# Patient Record
Sex: Male | Born: 1955
Health system: Southern US, Community
[De-identification: ages and names within clinical notes are randomized; demographics above are authoritative.]

## PROBLEM LIST (undated history)

## (undated) DIAGNOSIS — I2510 Atherosclerotic heart disease of native coronary artery without angina pectoris: Secondary | ICD-10-CM

## (undated) DIAGNOSIS — Z972 Presence of dental prosthetic device (complete) (partial): Secondary | ICD-10-CM

## (undated) DIAGNOSIS — E785 Hyperlipidemia, unspecified: Secondary | ICD-10-CM

## (undated) DIAGNOSIS — I2119 ST elevation (STEMI) myocardial infarction involving other coronary artery of inferior wall: Secondary | ICD-10-CM

## (undated) DIAGNOSIS — M2341 Loose body in knee, right knee: Secondary | ICD-10-CM

## (undated) DIAGNOSIS — S83249A Other tear of medial meniscus, current injury, unspecified knee, initial encounter: Secondary | ICD-10-CM

## (undated) DIAGNOSIS — K589 Irritable bowel syndrome without diarrhea: Secondary | ICD-10-CM

## (undated) DIAGNOSIS — I252 Old myocardial infarction: Secondary | ICD-10-CM

## (undated) HISTORY — DX: Old myocardial infarction: I25.2

## (undated) HISTORY — PX: KNEE ARTHROSCOPY: SHX127

## (undated) HISTORY — DX: Atherosclerotic heart disease of native coronary artery without angina pectoris: I25.10

## (undated) HISTORY — DX: Irritable bowel syndrome, unspecified: K58.9

## (undated) HISTORY — PX: FOOT SURGERY: SHX648

---
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
## 2002-01-04 ENCOUNTER — Emergency Department (HOSPITAL_COMMUNITY): Admission: EM | Admit: 2002-01-04 | Discharge: 2002-01-04 | Payer: Self-pay | Admitting: Emergency Medicine

## 2002-01-04 ENCOUNTER — Encounter: Payer: Self-pay | Admitting: Emergency Medicine

## 2002-07-11 ENCOUNTER — Encounter: Payer: Self-pay | Admitting: General Surgery

## 2002-07-11 ENCOUNTER — Ambulatory Visit (HOSPITAL_COMMUNITY): Admission: RE | Admit: 2002-07-11 | Discharge: 2002-07-11 | Payer: Self-pay | Admitting: Family Medicine

## 2002-07-11 ENCOUNTER — Encounter: Payer: Self-pay | Admitting: Family Medicine

## 2002-07-11 ENCOUNTER — Inpatient Hospital Stay (HOSPITAL_COMMUNITY): Admission: EM | Admit: 2002-07-11 | Discharge: 2002-07-12 | Payer: Self-pay | Admitting: Emergency Medicine

## 2002-07-11 ENCOUNTER — Encounter (INDEPENDENT_AMBULATORY_CARE_PROVIDER_SITE_OTHER): Payer: Self-pay | Admitting: *Deleted

## 2002-07-12 HISTORY — PX: APPENDECTOMY: SHX54

## 2002-08-25 ENCOUNTER — Encounter: Admission: RE | Admit: 2002-08-25 | Discharge: 2002-08-25 | Payer: Self-pay | Admitting: General Surgery

## 2002-08-25 ENCOUNTER — Encounter: Payer: Self-pay | Admitting: General Surgery

## 2004-03-28 ENCOUNTER — Emergency Department (HOSPITAL_COMMUNITY): Admission: EM | Admit: 2004-03-28 | Discharge: 2004-03-28 | Payer: Self-pay | Admitting: Emergency Medicine

## 2005-06-11 ENCOUNTER — Emergency Department (HOSPITAL_COMMUNITY): Admission: EM | Admit: 2005-06-11 | Discharge: 2005-06-11 | Payer: Self-pay | Admitting: Family Medicine

## 2007-11-22 ENCOUNTER — Inpatient Hospital Stay (HOSPITAL_COMMUNITY): Admission: AD | Admit: 2007-11-22 | Discharge: 2007-11-26 | Payer: Self-pay | Admitting: Cardiology

## 2007-11-22 ENCOUNTER — Ambulatory Visit: Payer: Self-pay | Admitting: Cardiology

## 2007-11-22 DIAGNOSIS — I252 Old myocardial infarction: Secondary | ICD-10-CM

## 2007-11-22 HISTORY — DX: Old myocardial infarction: I25.2

## 2007-11-22 HISTORY — PX: CARDIAC CATHETERIZATION: SHX172

## 2007-12-02 ENCOUNTER — Ambulatory Visit: Payer: Self-pay | Admitting: Cardiology

## 2007-12-02 LAB — CONVERTED CEMR LAB
BUN: 4 mg/dL — ABNORMAL LOW (ref 6–23)
CO2: 31 meq/L (ref 19–32)
Calcium: 9 mg/dL (ref 8.4–10.5)
Chloride: 104 meq/L (ref 96–112)
Creatinine, Ser: 0.9 mg/dL (ref 0.4–1.5)
GFR calc Af Amer: 114 mL/min
GFR calc non Af Amer: 95 mL/min
Glucose, Bld: 91 mg/dL (ref 70–99)
Potassium: 3.8 meq/L (ref 3.5–5.1)
Sodium: 139 meq/L (ref 135–145)

## 2007-12-09 ENCOUNTER — Ambulatory Visit: Payer: Self-pay

## 2007-12-18 ENCOUNTER — Ambulatory Visit: Payer: Self-pay | Admitting: Cardiology

## 2008-02-18 ENCOUNTER — Ambulatory Visit: Payer: Self-pay | Admitting: Cardiology

## 2008-02-21 ENCOUNTER — Ambulatory Visit (HOSPITAL_COMMUNITY): Admission: RE | Admit: 2008-02-21 | Discharge: 2008-02-22 | Payer: Self-pay | Admitting: Cardiology

## 2008-02-21 ENCOUNTER — Ambulatory Visit: Payer: Self-pay | Admitting: Cardiology

## 2008-02-21 HISTORY — PX: CARDIAC CATHETERIZATION: SHX172

## 2008-02-27 ENCOUNTER — Ambulatory Visit: Payer: Self-pay | Admitting: Cardiology

## 2008-02-28 ENCOUNTER — Inpatient Hospital Stay (HOSPITAL_COMMUNITY): Admission: RE | Admit: 2008-02-28 | Discharge: 2008-02-29 | Payer: Self-pay | Admitting: Cardiology

## 2008-02-28 HISTORY — PX: CARDIAC CATHETERIZATION: SHX172

## 2008-03-05 ENCOUNTER — Ambulatory Visit: Payer: Self-pay | Admitting: Cardiology

## 2008-03-05 ENCOUNTER — Ambulatory Visit: Payer: Self-pay

## 2008-03-11 ENCOUNTER — Ambulatory Visit: Payer: Self-pay | Admitting: Cardiovascular Disease

## 2008-05-26 ENCOUNTER — Ambulatory Visit: Payer: Self-pay | Admitting: Cardiology

## 2008-11-27 DIAGNOSIS — Z9089 Acquired absence of other organs: Secondary | ICD-10-CM

## 2008-11-27 DIAGNOSIS — Z9189 Other specified personal risk factors, not elsewhere classified: Secondary | ICD-10-CM

## 2008-11-27 DIAGNOSIS — E7800 Pure hypercholesterolemia, unspecified: Secondary | ICD-10-CM | POA: Insufficient documentation

## 2008-11-27 DIAGNOSIS — Z9861 Coronary angioplasty status: Secondary | ICD-10-CM

## 2008-11-27 DIAGNOSIS — I2510 Atherosclerotic heart disease of native coronary artery without angina pectoris: Secondary | ICD-10-CM | POA: Insufficient documentation

## 2008-11-27 DIAGNOSIS — I2119 ST elevation (STEMI) myocardial infarction involving other coronary artery of inferior wall: Secondary | ICD-10-CM | POA: Insufficient documentation

## 2008-11-27 HISTORY — DX: ST elevation (STEMI) myocardial infarction involving other coronary artery of inferior wall: I21.19

## 2009-01-28 ENCOUNTER — Ambulatory Visit: Payer: Self-pay | Admitting: Cardiology

## 2010-12-06 NOTE — Cardiovascular Report (Signed)
[name], [name]                  ACCOUNT NO.:  [id number]   MEDICAL RECORD NO.:  [id number]          PATIENT TYPE:  INP   LOCATION:  2906                         FACILITY:  [facility]   PHYSICIAN:  Noralyn Pick. Eden Emms, MD, FACCDATE OF BIRTH:  03-07-1956   DATE OF PROCEDURE:  11/22/2007  DATE OF DISCHARGE:                            CARDIAC CATHETERIZATION   INDICATION:  Acute inferior wall MI.   Left main coronary artery had a 40-50%  distal taper.  Left anterior  descending artery had 70% mid vessel disease with a small aneurysmal  segment.  Distal LAD was normal.   First diagonal branch had 30-40% Moschcowitz disease.   Circumflex coronary artery primarily consisted of one OM branch with a  70-80% midvessel lesion.   There are extensive left-to-right collaterals with totally occluded  right coronary artery.   Right coronary artery was 100% occluded proximally.   RAO ventriculography showed minimal inferobasal hypokinesis.  There was  no MR.  Aortic pressure was 110/60, LV pressure was 111/6.   IMPRESSION:  The patient is having an acute inferior wall myocardial  infarction.  He has three-vessel disease.  Dr. Riley Kill will proceed with  intervention of the proximal right coronary artery.  The patient will  probably be staged to have his LAD and circ done later.      Noralyn Pick. Eden Emms, MD, [facility]  Electronically Signed     PCN/MEDQ  D:  11/22/2007  T:  11/23/2007  Job:  [id number]

## 2010-12-06 NOTE — Assessment & Plan Note (Signed)
[facility]                            CARDIOLOGY OFFICE NOTE   NAME:[name], [name]                         MRN:          [id number]  DATE:03/05/2008                            DOB:          31-Oct-1955    [name] is in for followup.  He was readmitted to the hospital electively  for repeat for probable percutaneous intervention of the circumflex.  He  had recently undergone cutting balloon angioplasty of in-stent  restenosis of his acute myocardial infarction lesion.  At the time of  his repeat catheterization, we got a good look at his circumflex using a  guiding catheter and with the administration of intracoronary  nitroglycerin.  Despite maximum dilatation, Dr. Excell Seltzer and I thought  that, that a even potentially a 2.25 mm drug-eluting platform might be  oversized.  I debated whether or not to treat this area, and given the  potential that the distal runoff territory might be considerably  smaller, we elected to defer at this point.  A relook at the right  suggested that the cutting balloon angioplasty, while still patent and  likely not causing symptoms, was substantially lost even at less than 2  weeks.  As a result, we did go ahead and place a drug-eluting stent into  the right coronary artery for in-stent restenosis.  The final  angiographic result was excellent.  Since discharge from the hospital,  the patient has been basically asymptomatic.  He has not been having any  recurrent chest pain.  Fortunately, he has not smoked.  He has had a  little bit of discomfort in the left groin, and this has bothered him,  although it has not been severe.   MEDICATIONS:  1. Aspirin 325 mg daily.  2. Plavix 75 mg daily.  3. Lisinopril 5 mg daily.  4. Metoprolol tartrate 25 mg one-half tablet b.i.d.  5. Simvastatin 40 mg daily.   On physical, he is alert and oriented and in no distress.  The blood  pressure 110/70.  The pulse is 73.  The lung fields are  clear.  The  cardiac rhythm is regular.  The right groin appears to be well-healed.  There is minimal ecchymosis over the left groin, and a slightly widened  left femoral artery.  There is a soft bruit over the left groin.   The electrocardiogram demonstrates normal sinus rhythm.  There is some  biphasic T-waves in 3 and aVF, but no T-wave inversion in the lateral  precordial leads.   We went ahead and did duplex imaging of the left groin.  There was no  evidence of significant AV fistula or pseudoaneurysm.   We will have [name] take it easy on his leg for the next week or to.  I  plan to see him back in followup probably in about 3 weeks.  We will  hold him out of work until March 16, 2008.  Should there be any  problems, then he will return to see [place].     [name]. Riley Kill, MD, [facility]  Electronically Signed    TDS/MedQ  DD: 03/07/2008  DT: 03/07/2008  Job #: [id number]

## 2010-12-06 NOTE — Assessment & Plan Note (Signed)
[facility]                            CARDIOLOGY OFFICE NOTE   NAME:[name], [name]                         MRN:          [id number]  DATE:12/18/2007                            DOB:          June 16, 1956    [name] is in for follow-up.  Cardiac-wise, he has continued to remain  stable.  He would like to return to work at [employer].  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  Our  tentative date for return is December 27, 2007.  We have told him he  probably should not try to lift over about 20 pounds.  This would be  approximately 4 weeks following treatment for his acute myocardial  infarction.  We purposely elected not to do the circumflex as it was not  obviously causing symptoms, and it was somewhat technically difficult  with a long left main and turned back circumflex in a somewhat smaller  caliber vessel.  The patient underwent radionuclide imaging.  Importantly, the patient exercised for 6 minutes.  He did not have chest  pain or significant ST-T wave abnormalities.  At peak exercise, there  was an inferior perfusion defect.  This was predominantly scar with  perhaps very mild peri-infarct ischemia, a fairly common finding with  reperfused disease.  His ejection fraction was 54%.  None of this  suggested active ischemia, particularly in the circumflex territory, and  as such, we have discussed treatment options.  He also wants to come off  of most of his medications, but it is important that these continue as  they are.   MEDICATIONS:  1. Aspirin 325 mg daily.  2. Plavix 75 mg daily.  3. Lisinopril 5 mg daily.  4. Metoprolol tartrate 25 mg 1/2 tablet b.i.d.  5. Simvastatin 40 daily.   PHYSICAL EXAMINATION:  VITAL SIGNS:  Blood pressure is 110/74, the pulse  is 60.  The weight is 159 pounds.  LUNG:  The lung fields are clear.  CARDIAC:  The cardiac rhythm is regular.   We reviewed [name] issues in  detail today.  Our plan will be to  continue aggressive risk factor reduction.  I have had an extensive long  ranging discussion with he and his wife today about what this  constitutes.  He understands that his long-term prognosis is probably  mostly related to how well he handles this.  He has been exercising  regularly, not smoking, and according to his wife their diet has  improved.  She actually has lost 5 pounds on the current diet.  With  this, hopefully he will continue to remain stable.  He is scheduled to  be seen in follow-up by Dr. Joette Catching, who will be assuming his  primary care.  The patient needs a lipid and liver profile, as well as  basic metabolic profile.  I will forward this on to Dr. Lysbeth Galas for his  the review.  We appreciate his care.   ADDENDUM:  We will see him back in follow-up in about 6 weeks  to 2  months.     Arturo Morton. Riley Kill, MD, [facility]  Electronically Signed    TDS/MedQ  DD: 12/19/2007  DT: 12/19/2007  Job #: [id number]   cc:   Delaney Meigs, M.D.

## 2010-12-06 NOTE — Cardiovascular Report (Signed)
NAMEZACHREY, DEUTSCHER                  ACCOUNT NO.:  [id number]   MEDICAL RECORD NO.:  [id number]          PATIENT TYPE:  INP   LOCATION:  2906                         FACILITY:  [facility]   PHYSICIAN:  Arturo Morton. Riley Kill, MD, FACCDATE OF BIRTH:  [birth year]   DATE OF PROCEDURE:  DATE OF DISCHARGE:                            CARDIAC CATHETERIZATION   INDICATIONS:  The patient is a 55 year old who developed chest pain.  EMS was called.  He was seen in the field, and diagnosis of inferior ST  elevation noted.  The cath lab was called.  He was brought straight to  the catheterization laboratory.  He underwent diagnostic catheterization  by Dr. Charlton Haws.  This was done acutely.  I was called in for  percutaneous coronary intervention.  The patient had a totally occluded  right coronary artery with other disease as described in the diagnostic  report.   PROCEDURE:  1. Percutaneous stenting of the right coronary artery using 20 x 2.75      Liberte non drug-eluting stent.  2. Intravascular ultrasound.   DESCRIPTION OF THE PROCEDURE:  The patient was brought to the  catheterization laboratory and prepped and draped in the usual fashion  and underwent diagnostic catheterization by Dr. Eden Emms.  We eventually  administered bivalve en route, and an ACT was checked and found to be  appropriate.  The patient received chewable aspirin in the field and 600  mg of oral clopidogrel in the catheterization laboratory.  With an  appropriate ACT, a JR-4 guiding catheter with side holes was utilized.  A Prowater wire was taken down the vessel.  As we were getting ready to  cross, the patient had some ST resolution, and with the first crossing  the vessel was opened with subtotal occlusion of the RCA.  The RCA was  subsequently crossed with a Prowater wire and predilatation done with a  2.25 x 15 apex balloon.  Following this, the vessel was stented with a  20 x 2.75 Liberte non drug-eluting platform.  Post  dilatation was then  carefully performed using a 3.25 Quantum Maverick.  There was a 15-mm  balloon.  The distal edges were dilated using 6-8 atm balloon dilatation  and throughout the center of the stent.  The stent was dilated at 15  atm.  There was marked improvement in the appearance of the vessel.  Following this, the catheter was removed.  This was followed by  intravascular ultrasound.  Intravascular ultrasound was performed  distally to proximally.  This demonstrated good apposition of the stent.  There was extensive calcification proximally.  There was extensive  plaque formation, particularly distally throughout the course of the  vessel.  However, the vessel was not obstructive.  There was no obvious  evidence of edge tear.   With adequate stent placement and restoration of TIMI 3 flow, the  patient's pain was relieved and his EKG improved.  All catheters were  subsequently removed, and the femoral sheath was sewn into place.  He  was taken to the holding area in satisfactory clinical condition.   HEMODYNAMIC  DATA:  1. Central aortic pressure was 127/73 and mean 100.  2. Left atrial pressure was 110/17.  3. There was no gradient on pullback across the aortic valve.   ANGIOGRAPHIC DATA:  1. The left main coronary artery demonstrates what appears to be about      20% narrowing distally.  The vessel then opens and demonstrates a      first diagonal with about 60% narrowing.  It is relatively small.      There is a large second diagonal with 20%-30% narrowing and an area      of segmental plaquing of 60%-70% in the mid vessel.  The apical      vessel has about 40%-50% area of narrowing.  2. The circumflex is a single marginal predominantly with diffuse 40%-      50% plaquing after the first marginal and then a focal 80%-90%      eccentric plaque.  The vessel was relatively small in caliber.      There was a tiny first marginal that comes off proximal wall of      this with  about 70% narrowing.  3. The right coronary artery is totally occluded in its proximal      portion.  There is extensive collateralization of the distal      vessel.  Following dilatation, the proximal right was reduced from      100%-0%.  Proximal to the site of total occlusion there is      extensive calcification as noted in the intravascular ultrasound      procedure.  Multiple 30% lesions were then noted throughout the      distal portion of the midvessel, and there was a fair amount of      ectasia and luminal irregularity at the crux just proximal to the      PDA takeoff.  The PDA and posterolateral branches were all fairly      large-caliber vessels without critical disease.  4. The ventriculogram demonstrates what appears to be fairly severe      inferior hypokinesis despite the collateralization.  Ejection      fraction to be estimated at 45%-50%.   Intravascular ultrasound was performed distally to proximally.  Distally, there was a fair amount of extensive plaquing throughout the  vessel with lumens that measure from 2.2 up to about 2.2 x 3 throughout.  The stent itself demonstrates good apposition from distal to proximal  edge.  Just proximal to the vessel is an extensive area of calcification  as well as plaque.  There is no obvious edge tear findings.   CONCLUSIONS:  1. Successful percutaneous stenting of the right coronary artery in      the setting of acute myocardial infarction using non drug-eluting      platform.  2. Residual disease with moderate disease of the left anterior      descending artery and somewhat more severe disease of the proximal      circumflex.   DISPOSITION:  At the present time, we will review with our colleagues  the options with regard to treatment of other vessels.  The circ appears  to be reasonably tight.  The LAD may be able to be treated medically.  I  will review these options with my colleagues.      Arturo Morton. Riley Kill, MD, [facility]   Electronically Signed     TDS/MEDQ  D:  11/22/2007  T:  11/23/2007  Job:  [id number]  cc:   Noralyn Pick. Eden Emms, MD, [facility]  Gildardo Cranker

## 2010-12-06 NOTE — H&P (Signed)
[name], [name]                  ACCOUNT NO.:  [id number]   MEDICAL RECORD NO.:  [id number]          PATIENT TYPE:  OIB   LOCATION:  NA                           FACILITY:  [facility]   PHYSICIAN:  Arturo Morton. Riley Kill, MD, FACCDATE OF BIRTH:  1956/03/03   DATE OF ADMISSION:  DATE OF DISCHARGE:                              HISTORY & PHYSICAL   CHIEF COMPLAINT:  Chest discomfort.   HISTORY OF PRESENT ILLNESS:  [name] is a 55 year old well known to me.  He previously presented with an acute inferior wall myocardial  infarction.  This was in May [address]40.  He underwent successful treatment  of the right coronary artery in the setting of acute myocardial  infarction using a non drug-eluting platform.  He had residual high-  grade disease of the circumflex.  He also had moderate disease of the  LAD.  We elected to treat the right, and to treat him medically.  He  then presented with recurrent chest pain and last week underwent repeat  catheterization.  At this time, he underwent cutting balloon angioplasty  of the RCA.  We deferred whether or not to proceed with the circumflex  coronary artery, but in fact the patient had a somewhat more worrisome  lesion of the circumflex than previously.  The distal vessel was a  moderate vessel.  I subsequently reviewed the films with Dr. Juanda Chance and  Dr. Excell Seltzer, and we felt that treatment of this lesion should be  recommended.  His LV inferior hypokinesis has improved.  He does,  however has a mid inferior wall motion abnormality, potentially  attributable to the circumflex distribution.   CURRENT MEDICATIONS:  1. Aspirin 325 mg daily.  2. Plavix 75 mg daily.  3. Lisinopril 5 mg daily.  4. Metoprolol tartrate 25 mg one-half tablet b.i.d.  5. Simvastatin 40 mg daily.   PAST MEDICAL HISTORY:  Negative for diabetes, hypertension, or  significant hypercholesterolemia.  He previously used tobacco, but has  backed off on this.   PAST SURGICAL HISTORY:   Remarkable for foot surgery and appendectomy.   SOCIAL HISTORY:  The patient has a full-time job.  He also works with  [employer].   FAMILY HISTORY:  His mother is in good health.  His father did die of an  MI in his 67s.   PHYSICAL EXAMINATION:  GENERAL:  He is alert and oriented.  VITAL SIGNS:  His weight is 163 pounds, blood pressure 110/80, and pulse  is 60.  LUNGS:  Fields are clear.  CARDIAC:  Rhythm is regular without significant murmur.  EXTREMITIES:  Distal pulses are intact.   We have elected to go ahead recommend a percutaneous intervention of the  circumflex and this will be done on Friday.  The patient is agreeable.  I have reviewed the films with the patient and his spouse previously.      Arturo Morton. Riley Kill, MD, [facility]  Electronically Signed     TDS/MEDQ  D:  02/27/2008  T:  02/28/2008  Job:  [id number]

## 2010-12-06 NOTE — H&P (Signed)
NAMENAMAN, SPYCHALSKI                  ACCOUNT NO.:  [id number]   MEDICAL RECORD NO.:  [id number]         PATIENT TYPE:  COIB   LOCATION:                               FACILITY:  [facility]   PHYSICIAN:  Arturo Morton. Riley Kill, MD, FACCDATE OF BIRTH:  [birth year]   DATE OF ADMISSION:  02/21/2008  DATE OF DISCHARGE:                              HISTORY & PHYSICAL   CHIEF COMPLAINT:  Chest discomfort.   HISTORY OF PRESENT ILLNESS:  Mr. Duchene is a 55 year old gentleman who  previously presented with an acute myocardial infarction.  He apparently  had a question of light infarct in the past.  He presented to EMS with  ST elevation in the inferior leads.  He underwent urgent  catheterization.  At cath, there was about 20% distal left main  narrowing.  There was a first diagonal of 60%.  There was a large second  diagonal of 20-30% narrowing and an area of 60-70% narrowing, and apical  40-50%.  The circumflex was relatively small in caliber, but had a focal  80-90% eccentric plaque, the right was totally occluded and was  successfully stented.  There was extensive plaquing throughout the  vessel.  He subsequently has done well.  He had a followup radionuclide  imaging study.  The procedure was done at the beginning of May 2009 and  a followup nuclide studies suggested no significant lateral wall  ischemia.  There was evidence of inferior infarct with ejection fraction  of 54%.  Inferior defect was noted.  He was discharged from the  hospital. He has gotten along reasonably well, but for the past 3 weeks  he has noted some chest discomfort in the upper left chest.  He has also  had some tightness in the left shoulder.   As a result, we have recommended admission to the hospital for repeat  diagnostic cardiac catheterization.   PAST MEDICAL HISTORY:  Negative for diabetes, hypertension, or  hypercholesterolemia.  He has had a history of tobacco use.   The patient has had foot surgery and  appendectomy.   ALLERGIES:  Include no known drug allergies.   MEDICATIONS:  1. Aspirin 325 mg daily.  2. Plavix 75 mg daily.  3. Lisinopril 5 mg daily.  4. Metoprolol tartrate 25 mg one-half tablet p.o. b.i.d.  5. Simvastatin 40 mg daily.   FAMILY HISTORY:  His mother is in good health.  His father died of an MI  in his 24s.  He has siblings in good health currently.   REVIEW OF SYSTEMS:  He has had no blood in his stool.  He does have a  little bit of shoulder tightness as previously noted.  He has not had  loss of vision or major ENT problems.  Review of systems is otherwise  negative.   PHYSICAL EXAMINATION:  An alert and oriented gentleman, in no acute  distress.  Blood pressure 110/80, pulse 60, and weight 163.  Jugular  veins nondistended.  There are no carotid bruits.  Lung fields are clear  to auscultation and percussion.  PMI nondisplaced.  Normal  first and  second heart sound without murmurs, gallops, or rubs.  Abdomen is soft  without obvious hepatosplenomegaly.  Extremities, no edema.  Neuro is  nonfocal.   IMPRESSION:  1. Recurrent left chest discomfort with prior history of percutaneous      intervention for acute myocardial infarction with residual disease      in the circumflex system and recurrence of symptoms.  2. Hypercholesterolemia, on lipid-lowering therapy.   PLAN:  I have discussed the risks, options, and alternatives with the  patient.  He is agreeable to proceed.  We plan to re-look at his  coronary anatomy to make sure his status still open and that he has not  had a change in the circumflex system.  He may require dilatation of the  circumflex, but if there is absolutely no change, we may elect a  conservative course of management.      Arturo Morton. Riley Kill, MD, [facility]  Electronically Signed     TDS/MEDQ  D:  02/18/2008  T:  02/19/2008  Job:  [id number]

## 2010-12-06 NOTE — H&P (Signed)
[name], [name]                  ACCOUNT NO.:  [id number]   MEDICAL RECORD NO.:  [id number]          PATIENT TYPE:  INP   LOCATION:  2906                         FACILITY:  [facility]   PHYSICIAN:  Arturo Morton. Riley Kill, MD, FACCDATE OF BIRTH:  August 16, 1955   DATE OF ADMISSION:  11/22/2007  DATE OF DISCHARGE:                              HISTORY & PHYSICAL   PRIMARY CARDIOLOGIST:  Arturo Morton. Riley Kill, MD, [facility]   PRIMARY CARE PHYSICIAN:  Leslye Peer, MD in [place].   HISTORY OF PRESENT ILLNESS:  This is a 55 year old Caucasian male with  no prior history of cardiac disease (the patient states that he thought  that someone told him he had a light MI in the remote past about 25  years ago) with sudden complaint of severe pain in the back of his neck  with associated shortness of breath, nausea, and diaphoresis.  The  patient called EMS, EMS came, and EKG was completed revealing ST  elevation in leads II, III, and aVL.  He was brought emergently to [facility] for ST-elevated MI and brought to the  catheterization lab.  Dr. Charlton Haws saw the patient emergently and  got access and did cardiac catheterization revealing totally occluded  right coronary artery with collaterals and also obstructive disease in  the circumflex.  The patient was given baby aspirin en route by EMS, and  Dr. Riley Kill is now in attending for intervention.   REVIEW OF SYSTEMS:  Positive for shortness of breath, neck pain, chest  pain, diaphoresis, and low-grade nausea without vomiting.  Otherwise,  negative.   PAST MEDICAL HISTORY:  Negative for diabetes, hypertension, and  hypercholesterolemia.  Positive for tobacco abuse.   PAST SURGICAL HISTORY:  Foot surgery and appendectomy.   SOCIAL HISTORY:  He lives at [place] with his wife.  He is a Psychologist, educational.  He has 40-pack-year tobacco.  Occasional alcohol use.  Negative for drug  abuse.   FAMILY HISTORY:  Mother is in good health.  Father died of  an MI in his  36s.  He has siblings in good health.   CURRENT MEDICATION:  Flexeril 10 mg p.r.n.   ALLERGIES:  No known drug allergies.   CURRENT LABS:  Sodium 137, potassium 4.2, chloride 101, CO2 7,  creatinine 0.8, and glucose 142.  Hemoglobin 12.9 and hematocrit 38.0.  EKG revealing heart rate of 64 beats per minute with ST elevation in  leads II, III, and aVL.   PHYSICAL EXAMINATION:  VITAL SIGNS:  Blood pressure 113/74, pulse 67,  respirations 26, O2 sat 100% on 2 L, and weight 175 pounds.  GENERAL:  He is awake, alert, and oriented with complaints of severe  neck and shoulder pain.  HEENT:  Head is normocephalic and atraumatic.  Eyes, PERRLA.  Mucous  membranes of mouth are pink and moist.  Tongue is midline.  NECK:  Supple.  There is no JVD.  No carotid bruits are present.  CARDIOVASCULAR:  Regular rate and rhythm without murmurs, rubs, or  gallops.  Pulses are 2+ and equal bilaterally.  LUNGS:  Essentially clear to auscultation.  ABDOMEN:  Soft and nontender with 2+ bowel sounds.  No rebound or  guarding is noted.  EXTREMITIES:  Without clubbing, cyanosis, or edema.  NEURO:  Cranial nerves II through XII are grossly intact.   IMPRESSION:  1. Acute ST-elevated myocardial infarction inferiorly.  2. Tobacco abuse.   PLAN:  The patient is taken emergently to cardiac catheterization lab,  where cardiac catheterization was performed by Dr. Charlton Haws  emergently revealing totally occluded right coronary artery with also  some circumflex disease.  Collaterals were noted distally in the right  coronary artery.  The patient is currently undergoing emergent PCI per  Dr. Shawnie Pons with more recommendations post procedure per Dr.  Riley Kill.  The patient will also be given smoking cessation consultation,  and lipids and LFTs will be ordered in the morning.  The patient will be  admitted and followed closely post procedure.      Bettey Mare. Lyman Bishop, NP      Arturo Morton.  Riley Kill, MD, [facility]  Electronically Signed    KML/MEDQ  D:  11/22/2007  T:  11/23/2007  Job:  [id number]

## 2010-12-06 NOTE — Letter (Signed)
February 18, 2008    Delaney Meigs, M.D.  [address]  [place], [place]  [address]   RE:  [name], [name]  MRN:  [id number]  /  DOB:  [birth year]   Dear Marolyn Haller,   I had the pleasure of seeing your nice patient [name] in the office  today in followup.  Cardiac wise, he is stable.  However, he has  recently had the onset of some discomfort in the upper left chest.  He  has been under a fair amount of stress at work.  He says that he has not  been smoking.  His cholesterol results from your office look excellent.  Nonetheless, the symptoms have been concerning to the patient, and as  you know at the time of his original presentation, we did step the right  residual, but we did not change the circumflex.   Now, he is somewhat concerned about this.   On physical exam today, the blood pressure 110/80 and the pulse is 60.  The lung fields are clear.  The cardiac rhythm is regular.   Electrocardiogram demonstrates normal sinus rhythm with possible  infarct, indeterminate age.   Overall, he is stable, but I am concerned about the recurrence of  symptoms.  We elected initially to consider dilatation of the  circumflex, but then I elected to recommend the conservative course  of management.  He has been under quite a bit of stress, this may be  contributing to the chest discomfort, but he is concerned about it so am  I.  We have elected to recommend a repeat catheterization to reassess  his coronary anatomy.  I will be in touch with you once the results of  this are complete.  We have arranged this for Friday of this week.    Sincerely,      Arturo Morton. Riley Kill, MD, [facility]  Electronically Signed    TDS/MedQ  DD: 02/18/2008  DT: 02/19/2008  Job #: [id number]

## 2010-12-06 NOTE — Discharge Summary (Signed)
[name], [name]                  ACCOUNT NO.:  [id number]   MEDICAL RECORD NO.:  [id number]          PATIENT TYPE:  OIB   LOCATION:  2622                         FACILITY:  [facility]   PHYSICIAN:  Arturo Morton. Riley Kill, MD, FACCDATE OF BIRTH:  [birth year]   DATE OF ADMISSION:  02/21/2008  DATE OF DISCHARGE:  02/22/2008                               DISCHARGE SUMMARY   CHIEF COMPLAINT:  Chest discomfort.   HISTORY OF PRESENT ILLNESS:  Mr. Sabino is a very pleasant 55 year old  well known to me.  He previously presented with a myocardial infarction.  He underwent percutaneous intervention in early May, at that time had  stenting of the right coronary artery.  He continued to have high-grade  residual disease of a somewhat smaller caliber circumflex artery.  We  elected to treat him medically and follow-up myocardial perfusion  imaging studies were normal.  He developed recurrent chest pain and was  brought to the catheterization laboratory in followup for repeat  evaluation.   Please see admission history and physical for further details.   HOSPITAL COURSE:  The patient was admitted and underwent repeat cardiac  catheterization.  This demonstrated scattered nonobstructive disease of  the distal LAD.  There was a single marginal branch with about an 80%  stenosis.  There was a high-grade in-stent restenosis in the midportion  of the right coronary artery in the midportion of the stent.  This was  successfully dilated using a cutting balloon angioplasty, largely  because the area of restenosis was not diffuse, in-stent restenosis, and  rather very focal narrowing of less than 10 mm.  The distal right  coronary artery was a large caliber vessel.  The LV was improved from  the original study with only mild residual inferior hypokinesis.   Following the procedure, the patient was taken to the 2600 unit and she  subsequently removed.  At the time of discharge, his groin was stable  and we  reviewed his course in detail with him.  His medicines were  reviewed.  The patient was discharged in satisfactory clinical condition  and it is my plan to call him this week.  We plan to review with my  colleagues his residual circumflex disease.  The circumflex itself is  approachable, but could be technically challenging because of a long  left main, and a smaller caliber.  We will decide whether he needs  continued medical therapy, we will discuss percutaneous intervention.  This will be reviewed and I will communicate this to the patient by  phone and we will see him in followup in the office.  This was reviewed  with the patient in detail.   DISCHARGE INSTRUCTIONS:  1. The patient is to take it easy over the next several days.  2. Use soap and water on his groin which is stable at the time of      discharge.  3. Avoid heavy lifting.   MEDICATIONS AT DISCHARGE:  1. Aspirin 325 mg daily.  2. Clopidogrel 75 mg daily.  3. Lisinopril 5 mg daily.  4. Simvastatin 40  mg daily,  5. Metoprolol 25 mg 1/2 tablet twice daily.  6. Nitroglycerin p.r.n.   CONDITION AT DISCHARGE:  Improved.   FINAL DIAGNOSES:  1. Coronary artery disease with prior inferior wall myocardial      infarction and subsequent in-stent restenosis treated with primary      percutaneous coronary intervention.  2. Hypercholesterolemia.  3. Prior foot surgery.  4. Prior appendectomy.      Arturo Morton. Riley Kill, MD, [facility]  Electronically Signed     TDS/MEDQ  D:  02/22/2008  T:  02/22/2008  Job:  [id number]

## 2010-12-06 NOTE — Assessment & Plan Note (Signed)
[facility]                                 ON-CALL NOTE   NAME:Mccoll, [name]                         MRN:          [id number]  DATE:02/26/2008                            DOB:          Oct 27, 1955    I called [name] today.  I reviewed his films with both Dr. Juanda Chance and  Dr. Excell Seltzer.  To summarize, the patient has previously been treated for  instent restenosis in his right coronary artery.  He has remaining  fairly high grade lesion in the circumflex, which supplies a single  branch.  We talked about various strategies.  I reviewed this with Dr.  Juanda Chance and then subsequently with Dr. Excell Seltzer, both were in favor of  going ahead and treating the vessel.  We have tentatively set the  patient up to have the procedure done on Friday of this week.  He is  agreeable and actually wishes to proceed.     Arturo Morton. Riley Kill, MD, [facility]  Electronically Signed    TDS/MedQ  DD: 02/27/2008  DT: 02/28/2008  Job #: [id number]

## 2010-12-06 NOTE — Assessment & Plan Note (Signed)
[facility]                            CARDIOLOGY OFFICE NOTE   NAME:Tesoriero, KASPIAN MUCCIO                         MRN:          [id number]  DATE:05/26/2008                            DOB:          11/27/1955    Mr. Peak is in for followup.  He had one week in where he really felt  bad.  There was nothing specific and he had no chest pain whatsoever.  He just said he felt little down and depressed.  He wonders whether this  may be something that he has.  He has had no real chest tightness.  His  mother has recently been in the hospital with what sounds like atrial  fibrillation.  Otherwise, he has been stable.   MEDICATIONS:  1. Aspirin 325 mg daily.  2. Plavix 75 mg daily.  3. Lisinopril 5 mg daily.  4. Simvastatin 40 mg daily.  5. Metoprolol 25 mg one-fourth tablet p.o. b.i.d.   PHYSICAL EXAMINATION:  GENERAL:  He is alert and oriented in no  distress.  VITAL SIGNS:  The blood pressure is 110/65, the pulse is 67.  LUNGS:  The lung fields are clear.  CARDIAC:  Rhythm is regular.  There is no significant gallop noted.  EXTREMITIES:  No edema.   IMPRESSION:  1. Coronary artery disease status post inferior wall infarction      treated with stenting to the right coronary artery in May 2009 and      subsequent cutting balloon angioplasty for right coronary artery in-      stent restenosis, February 20, 2008, followed by a PROMUS drug-eluting      stent in August 2009.  2. Hypercholesterolemia.  3. History of tobacco use.   PLAN:  1. Continue current medical regimen.  2. Follow up with Dr. Ardeen Garland.  3. Encourage continuation of tobacco discontinuation.     Arturo Morton. Riley Kill, MD, [facility]  Electronically Signed    TDS/MedQ  DD: 05/26/2008  DT: 05/27/2008  Job #: [id number]   cc:   Delaney Meigs, M.D.

## 2010-12-06 NOTE — Assessment & Plan Note (Signed)
[facility]                            CARDIOLOGY OFFICE NOTE   NAME:[name], [name]                         MRN:          [id number]  DATE:03/11/2008                            DOB:          1955-10-27    The patient seen in the [facility] on March 11, 2008.   PRIMARY CARDIOLOGIST:  [name]. Billy Kill, MD, [facility]   PRIMARY CARE PHYSICIAN:  Billy Meigs, MD   This is a 55 year old married white male patient, who has history of  coronary artery disease, who recently underwent cutting balloon  angioplasty of in-stent restenosis of an RCA.  He had Promus drug-  eluting stent deployed to the RCA on February 28, 2008.  He had a cutting  balloon angioplasty of the RCA on February 21, 2008, and came in on March 05, 2008.   Since the patient has been at home, he has done quite well.  He denies  any chest pain, palpitations, dizziness, or presyncope.  He did have  some trouble with pain in his left coronary and underwent duplex  scanning, which showed no evidence of pseudoaneurysm.   CURRENT MEDICATIONS:  1. Aspirin 325 mg daily.  2. Plavix 75 mg daily.  3. Lisinopril 5 mg daily.  4. Metoprolol 25 mg one-quarter b.i.d.  5. Simvastatin 40 mg daily.   PHYSICAL EXAMINATION:  GENERAL:  This is a pleasant 55 year old white  male in no acute distress.  VITAL SIGNS:  Blood pressure 123/75, pulse 55, weight 162.  NECK:  Without JVD, HJR, bruit, or thyroid enlargement.  LUNGS:  Clear, anterior, posterior, and lateral.  HEART:  Regular rate and rhythm at 70 beats per minute.  Normal S1 and  S2.  Positive S4.  No murmur, rub, bruit, thrill, or heave noted.  ABDOMEN:  Soft without organomegaly, masses, lesions, or abnormal  tenderness.  Left groin has a small knot and hematoma that is resolving.  Bruising is resolved.  Right groin is stable.  LOWER EXTREMITIES:  Without cyanosis, clubbing, or edema.  He has good  distal pulses.   IMPRESSION:  1. Coronary  artery disease status post inferior wall myocardial      infarction treated with stent to the RCA in May 2009.  2. Status post cutting balloon angioplasty of the RCA for restenosis      on February 21, 2008.  3. Status post Promus drug-eluting stent to the RCA on February 28, 2008,      for in-stent restenosis.  4. Status post drug-eluting stent to the RCA on March 05, 2008.  He      still has residual circumflex disease.  5. Hypercholesterolemia.  6. Prior tobacco use, quit in May.  7. Family history of coronary artery disease.   PLAN:  The patient is stable from a cardiac standpoint.  I have written  him a note that he can return to work on Monday with no heavy lifting  greater than 20 pounds.  He will see Dr. Riley Brewer back in 2-3 months.      Jacolyn Reedy, PA-C  Electronically  Signed      Everardo Beals Juanda Chance, MD, [facility]  Electronically Signed   ML/MedQ  DD: 03/11/2008  DT: 03/11/2008  Job #: [id number]   cc:   [name], M.D.

## 2010-12-06 NOTE — Cardiovascular Report (Signed)
[name], SLINKER                  ACCOUNT NO.:  [id number]   MEDICAL RECORD NO.:  [id number]          PATIENT TYPE:  INP   LOCATION:  2629                         FACILITY:  [facility]   PHYSICIAN:  Billy Morton. 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  He  initially presented with an acute inferior wall myocardial infarction in  early May.  At that time, he had acute stenting of the right coronary  artery.  We elected to treat a complicated circumflex medically.  He has  subsequently done well, but recently developed some recurrent chest  discomfort.  As a result, he was brought back in and had a high-grade in-  stent restenosis of the right coronary artery which was treated with  cutting balloon.  He continued to have moderately high-grade disease of  the circumflex with about an 80-90% stenosis and we debated whether or  not to treat this.  After review with Dr. Juanda Chance and Dr. Excell Seltzer, we  elected to recommend percutaneous opening of the circumflex artery.  He  was brought back today for that study.   PROCEDURES:  1. Percutaneous stenting with a drug-eluting stent of a recurrent in-      stent restenosis.  2. Left coronary angiography.   DESCRIPTION OF PROCEDURE:  The patient was brought to the cath lab and  prepped and draped in the usual fashion.  Through an anterior puncture,  the left femoral artery was entered and a 6-French sheath was placed.  Bivalirudin was given according to protocol.  With the initial  injections of the right coronary artery to reassess the treatment from a  week earlier, the patient had some hazy restenosis of about 50-60% in  the midportion of the stent which had been nicely treated with cutting  balloon angioplasty 1 week earlier.  We then used a CLS 3.5 to gain  access to the left main and  circumflex, and with this additional views  of the circumflex were obtained.  Unfortunately, with a larger guiding  catheter, the appearance of the circumflex suggested that at best in the  basal state this vessel was no more than about 1.5-mm artery.  Intracoronary nitroglycerin was then administered, and despite this, it  did not appear to be much larger than 2.0.  I called Calton Dach to the  catheterization laboratory and Dr. Excell Seltzer and I reviewed the films in  detail.  At that time, we felt that with treatment with a 2-0 non-drug-  eluting stent, the likelihood of restenosis given the length of  treatment was really quite high.  In addition, it was felt that a 2.25  drug-eluting platform would likely be significantly oversized.  As a  result and given the distribution of the vessel, I carefully decided not  to proceed with this treating this.  In contrast, we were concerned  about the in-stent restenosis from 1 week earlier.  The Bivalirudin had  been given according to  protocol and ACT was appropriate.  I discussed  the findings with the patient, and we elected to proceed on with  treating the in-stent renarrowing now with a drug-eluting stent.  A 15 x  2.75 Promus drug-eluting stent was then deployed at 15 atmospheres and  subsequently post dilated with a 3-0 [place] Voyager balloon to 14-15  atmospheres as well.  There was marked improvement in the appearance of  the artery.  I then reviewed the pictures with the patient, and then  went back and I reviewed them with his wife in the control room.  He  tolerated the procedure well and there were no complications.  The  femoral sheath was sutured into place.  He was taken to the holding area  in satisfactory clinical condition.   ANGIOGRAPHIC DATA:  1. The left main is a somewhat smaller caliber vessel of just over 2      mm, probably is in the range of about 2.2-2.5 mm in size.  There      was not damping of the guiding catheter,  however, with engagement.      The circumflex itself is about 1.5 to 175-mm artery that has an 80-      90% calcified stenosis in the midportion.  There is more segmental      plaquing proximally in the vessel and the lesion itself is about 15      mm in length.  The distal vessel was a single caliber marginal      branch, and we elected to not intervene on this vessel.  2. The right coronary artery is a very large caliber vessel supplying      a posterior descending branch and multiple posterolateral branches.      The findings in this vessel were characterized from 1 week earlier.      In the area that had been treated week earlier for focal in-stent      restenosis with a cutting balloon, there was recurrent 50-60% hazy      renarrowing in this location.  As a result, we elected to treat      this and there was a reduction in stenosis from 60 to 0%.  There      was a good angiographic result at the completion of procedure.   CONCLUSIONS:  1. Successful percutaneous stenting of in-stent restenosis of the      right coronary artery.  2. Continued medical therapy of the circumflex artery.   DISPOSITION:  The patient will be followed closely.  Should there be any  change in status, we will reassess treatment.      Billy Morton. Riley Kill, MD, [facility]  Electronically Signed     TDS/MEDQ  D:  02/28/2008  T:  02/29/2008  Job:  [id number]   cc:   [name], M.D.  CV Laboratory

## 2010-12-06 NOTE — Discharge Summary (Signed)
[name], [name]                  ACCOUNT NO.:  [id number]   MEDICAL RECORD NO.:  [id number]          PATIENT TYPE:  INP   LOCATION:  3705                         FACILITY:  [facility]   PHYSICIAN:  [name], ANP DATE OF BIRTH:  02/20/1956   DATE OF ADMISSION:  11/22/2007  DATE OF DISCHARGE:  11/26/2007                               DISCHARGE SUMMARY   DISCHARGE DIAGNOSIS:  Acute inferior ST-segment elevation myocardial  infarction.   SECONDARY DIAGNOSES:  1. Coronary artery disease.  2. Ongoing tobacco abuse.  3. Hyperlipidemia.   ALLERGIES:  No known drug allergies.   PROCEDURES:  Left heart cardiac catheterization with successful  percutaneous coronary intervention and stenting of an occluded right  coronary artery with placement of 2.75 x 20 mm Liberte bare-metal stent.   HISTORY OF PRESENT ILLNESS:  A 55 year old Caucasian male revealed with  no prior cardiac history, who was in his usual state health until Nov 22, 2007, when he had sudden onset of severe posterior neck pain associated  with shortness of breath, nausea, and diaphoresis.  The patient  activated EMS, and upon EMS arrival, EKG was performed revealing ST  elevation in leads II, III, and aVF.  The patient was taken emergently  to be [facility].   HOSPITAL COURSE:  Left heart cardiac catheterization was performed  revealing an occluded proximal right coronary artery with moderate  diffuse stenosis throughout the left coronary tree including a 60-70%  stenosis in the proximal-to-mid LAD and an 80% stenosis in the proximal  left circumflex.  LV function was normal with minimal inferobasal  hypokinesis.  Attention was turned to the right coronary artery, which  was felt to be the infarcted vessel, and this was successfully stented  with a 2.75 x 20 mm Liberte bare-metal stent.  The patient tolerated the  procedure well and was monitored post procedure in the Coronary  Intensive Care Unit.  He did  peak his CK at 2994, MB at 284.4, and  troponin I at greater than 100.  The patient was transferred out to the  floor and counseled on smoking cessation.  Films were reviewed by Dr.  Riley Kill for consideration of PCI to the left circumflex; however, it was  felt that percutaneous treatment of the circumflex could be higher risk  and unfavorable secondary to angulation tortuosity off the left main.  Further, because [name] has had no additional discomfort, we have  offered this time for medical therapy.  We will arrange followup with  Dr. Riley Kill in 1 week as well as an exercise Myoview in approximately 2  weeks to determine the ischemic burden of the left circumflex lesion.   [name] has been ambulating without difficulty or current symptoms,  will be discharged home today in good condition.   DISCHARGE LABS:  Hemoglobin 13.3, hematocrit 39.2, WBC 11.2, and  platelets 301.  Sodium 138, potassium 3.9, chloride 110, CO2 24, BUN 75,  creatinine 0.86, and glucose 92.  Total bilirubin 0.65, alkaline  phosphatase 94, AST 100, ALT 40, total protein 5.4, albumin 2.7, and  calcium 8.9.  CK 1717, MB 107.0, and troponin I 56.72.  Total  cholesterol 163, triglycerides 87, HDL 29, and LDL 107.  TSH 0.868.   DISPOSITION:  The patient is being discharged home today in good  condition.   FOLLOWUP:  He has an appointment.  We will arrange for followup with Dr.  Riley Kill on Dec 02, 2007 at 2:45 p.m.  We will also arrange for an  outpatient exercise Myoview on Dec 09, 2007 at 9 a.m.  The patient is  asked to follow up with Dr. Gildardo Cranker as previously scheduled.   DISCHARGE MEDICATIONS:  1. Aspirin 325 mg daily.  2. Plavix 75 mg daily.  3. Lipitor 80 mg nightly.  4. Lisinopril 5 mg daily.  5. Lopressor 12. 5 mg b.i.d.  6. Nitroglycerin 0.4 mg sublingual p.r.n. chest pain.   PENDING LAB STUDIES:  None.   THE PATIENT'S DISCHARGING TIME:  60 minutes including physician time.      [name], ANP     CB/MEDQ  D:  11/26/2007  T:  11/27/2007  Job:  [id number]   cc:   Gildardo Cranker, MD

## 2010-12-06 NOTE — Discharge Summary (Signed)
[name], [name]                  ACCOUNT NO.:  [id number]   MEDICAL RECORD NO.:  [id number]          PATIENT TYPE:  INP   LOCATION:  2629                         FACILITY:  [facility]   PHYSICIAN:  Arturo Morton. Riley Kill, MD, FACCDATE OF BIRTH:  01-Jun-1956   DATE OF ADMISSION:  02/28/2008  DATE OF DISCHARGE:                               DISCHARGE SUMMARY   FINAL DIAGNOSES:  1. Coronary artery disease with repeat cardiac catheterization,      percutaneous intervention for recurrent in-stent restenosis.  2. Hypercholesterolemia.  3. Prior tobacco use.  4. Prior foot surgery.  5. Prior appendectomy.   DISCHARGE MEDICATIONS:  1. Aspirin 325 mg daily.  2. Clopidogrel 75 mg daily.  3. Lisinopril 5 mg daily.  4. Simvastatin 40 mg daily.  5. Metoprolol 25 mg one-fourth tablet twice daily.  6. Nitroglycerin p.r.n.   CONDITION AT DISCHARGE:  Improved.   HOSPITAL COURSE:  The patient was brought in for repeat catheterization  with plans for dilatation of the circumflex artery.  After thorough  review by Dr. Excell Seltzer and Dr. Juanda Chance and myself, our original plan was to  dilate the remaining circumflex marginal.  On the previous angiograms  done a week earlier, it was felt that with nitroglycerin the vessel  opened up somewhat.  He was brought back to the catheterization  laboratory, and guiding views obtained.  Importantly, with these guiding  views even with nitroglycerin and now with a guiding catheter in place,  it appeared that at best a 2.25-mm drug-eluting stent would result in  moderate over sizing of the vessel.  Once it was deployed, the basal  vessel appeared to be somewhere between about 1.6 and 1.9 mm.  With  nitroglycerin, it became somewhat bigger.  We had a thorough discussion  about options with Dr. Excell Seltzer, and I elected not to dilate this.  In  contrast, the right coronary artery had been previously stented in the  setting of acute myocardial infarction with a nondrug-eluting  stent.  The patient presented a week ago and underwent repeat percutaneous  intervention.  At that time, despite the stent length, there was a small  area of focal restenosis.  Cutting balloon angioplasty was performed  followed by balloon dilatation, and the final angiographic result was  good.  However, at this time, there was already moderate early  restenosis with some haziness.  We discussed the options.  We elected to  go ahead and place a drug-eluting stent in the right coronary for in-  stent restenosis.  A Promus drug-eluting stent was deployed followed by  post dilatation.   The patient is stable at this time, and ready for discharge.  His groin  is stable.  I have reviewed his case with him for more than an hour, and  explained this to him in detail.  He understands.  We will see him back  in the office on Thursday, and likely we will release  him to work  shortly thereafter.  He has been entered in the [employer] registry.  The  patient will call us for  any further problems.      Arturo Morton. Riley Kill, MD, [facility]  Electronically Signed     TDS/MEDQ  D:  02/29/2008  T:  02/29/2008  Job:  [id number]   cc:   Delaney Meigs, M.D.

## 2010-12-06 NOTE — Cardiovascular Report (Signed)
NAMEEASTEN, MACEACHERN                  ACCOUNT NO.:  [id number]   MEDICAL RECORD NO.:  [id number]          PATIENT TYPE:  OIB   LOCATION:  2622                         FACILITY:  [facility]   PHYSICIAN:  Arturo Morton. Riley Kill, MD, FACCDATE OF BIRTH:  May 21, 1956   DATE OF PROCEDURE:  02/21/2008  DATE OF DISCHARGE:                            CARDIAC CATHETERIZATION   INDICATIONS:  Mr. Rezendes is well known to me.  He is a 55 year old  gentleman who presented with an acute inferior wall infarction and was  treated with primary stenting.  He had a residual high-grade stenosis in  the circumflex, but it was somewhat unfavorable with a long left main,  smaller caliber, and somewhat segmental disease.  As a result, he was  treated medically.  Recently, he has had increasing fatigue, and he has  had some intermittent chest discomfort.  He was brought back in today  for repeat cardiac cath.   PROCEDURES:  1. Left heart catheterization.  2. Selective coronary arteriography.  3. Selective left ventriculography.  4. Percutaneous cutting balloon angioplasty of in-stent restenosis.   DESCRIPTION OF PROCEDURE:  The patient was brought to the cath lab and  prepped and draped in the usual fashion.  Through an anterior puncture,  the right femoral artery was easily entered.  A 5-French sheath was then  placed.  We then took views of the left and right coronary arteries.  Central aortic and left ventricular pressures were measured with  pigtail.  Ventriculography was performed in the RAO projection.  He  tolerated this reasonably well but did complain of some back pain for  which he was given appropriate medication.  Following this, we elected  to recommend proceeding on with percutaneous intervention.  The patient  was modestly uncomfortable at this point, so we proceeded with the  percutaneous intervention of the right coronary artery which is a large-  caliber vessel.  There is a focal in-stent restenosis.   Bivalirudin was  given according to protocol.  The patient had received Plavix and  aspirin earlier this morning.  When the ACT was appropriate for  intervention, we crossed with a Prowater wire.  Two dilatations were  then done with a 2.5 x 10 cutting balloon.  There was marked improvement  in the appearance of the artery.  With this, we debated about a drug-  eluting stent, but given the focality of the in-stent restenosis, we  elected to attempt another try at routine angioplasty in this lesion.  Given the complexity of the circumflex lesion, we did not feel we should  proceed on, and further consultation will be obtained prior to  intervening on this lesion.   I then spoke with the patient and subsequently his wife with all of the  information.   HEMODYNAMIC DATA:  1. Central aortic pressure was 109/61, mean 81.  2. Left ventricular pressure 118/12.  3. There is no gradient pullback across aortic valve.   ANGIOGRAPHIC DATA:  1. The left main tapers somewhat, with minimal luminal irregularity      but is widely patent.  Unfortunately,  it is very long left main      with sharp angle turn into the circumflex.  2. The LAD courses to the apex.  There is some luminal irregularity,      particularly after the takeoff of the second diagonal with about      50% narrowing followed by segmental 40-50% narrowing.  There is a      more focal 50% narrowing distally.  The distal vessel wraps the      apex.  3. The circumflex provides primarily a single marginal branch.  It is      relatively small in caliber, and its distribution is not extremely      large.  There is an 80% focal lesion followed by 70% segmental      disease.  Prior to intracoronary nitroglycerin administration, this      vessel appeared about 2 mm in size.  With intracoronary      nitroglycerin, it appears to approach more of a 2.25-mm vessel.  4. The right coronary artery is diffusely irregular.  There is heavy       calcification proximally, then a widely patent stent except for a      focal area of in-stent restenosis that measures about 6-7 mm in      length.  The degree of stenosis is about 80%.  Following cutting      balloon angioplasty, this reduced to less than 10-20% residual      narrowing with very smooth margins.  The remainder of the stent      remained untouched.  More distally, there is diffuse luminal      irregularity but an aneurysmal cap just prior to the PDA takeoff.      The PDA and posterolateral branches are extremely large vessels,      and these all demonstrate no critical narrowing.  The PDA and      posterolateral system were all extremely large.  5. The ventriculogram demonstrates very mild hypokinesis of the      inferior segment.  Ejection fraction is 50-55% compared to the      previous study.  There is fairly significant improvement in the      inferior wall regional motion.   CONCLUSIONS:  1. High-grade in-stent restenosis in a very focal manner with      successful cutting balloon angioplasty at this site.  2. High-grade stenosis in the circumflex marginal.  3. Scattered noncritical disease of the left anterior descending as      previously noted.  4. Improved regional wall motion and overall left ventricular function      from the acute myocardial infarction study of Nov 22, 2007.   DISPOSITION:  1. The patient will remain on aspirin and Plavix.  2. I have explained the potential for in-stent restenosis again to the      patient, and subsequently if this would recur, then we would likely      treat with drug-eluting stent.  3. We will discuss treatment of the residual circumflex with a drug-      eluting stent.  It is approaching 2.25, but the angulation of the      circ may prove more difficult than expected.  I will review with my      colleagues.      Arturo Morton. Riley Kill, MD, [facility]  Electronically Signed     TDS/MEDQ  D:  02/21/2008  T:  02/22/2008  Job:   [id number]  cc:   [facility]  Delaney Meigs, M.D.

## 2010-12-06 NOTE — Assessment & Plan Note (Signed)
[facility]                            CARDIOLOGY OFFICE NOTE   NAME:Vanhecke, NATANAEL SALADIN                         MRN:          [id number]  DATE:12/02/2007                            DOB:          [birth year]    Mr. Dehart is in for follow-up.  Since discharged from the hospital, he is  doing well.  He did wake up sick the other morning, vomited but felt  much better thereafter.  He has not had any recurrent symptoms since  then and he and his wife have been walking regularly.  We elected to  treat his acute myocardial infarction lesion.  He has a circumflex that  is somewhat unfavorable due to its tortuous anatomy, and is a relatively  smaller vessel.  He has a moderate disease of the LAD.  We have  scheduled him for radionuclide imaging study next Monday, the purpose of  that is to assess his risk for future cardiac events.  He has been doing  well since that time and is now back for follow-up.   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   MEDICATIONS:  1. Aspirin 325 mg daily.  2. Plavix 75 mg daily.  3. Lisinopril 5 mg daily.  4. Metoprolol tartrate 25 mg one half tablet b.i.d.  5. Simvastatin 80.   PHYSICAL EXAMINATION:  VITAL SIGNS:  The blood pressure is 122/70, pulse  59.  LUNGS:  The lung fields are clear.  CARDIOVASCULAR:  The cardiac rhythm is regular.   EKG today reveals sinus rhythm.  There are small inferior Q waves with  some T-wave inversion in III and aVF.   IMPRESSION:  1. Recent inferior wall myocardial infarction.  2. Residual coronary disease involving both the lumbodorsal fascia and      circumflex.  3. Hypercholesterolemia.   PLAN:  1. Exercise Myoview will be done next week to assess risk.  2. We will decrease his simvastatin to 40 mg daily.  3. I have reinforced the need not to smoke.  4. If he does well on the Myoview, we will allow him to return to work      at four weeks.  5. I have  encouraged him not to be actively involved in fire runs.    Arturo Morton. Riley Kill, MD, [facility]  Electronically Signed   TDS/MedQ  DD: 12/02/2007  DT: 12/02/2007  Job #: [id number]

## 2010-12-09 NOTE — H&P (Signed)
[name], [name]                            ACCOUNT NO.:  [id number]   MEDICAL RECORD NO.:  [id number]                   PATIENT TYPE:  INP   LOCATION:  5731                                 FACILITY:  [facility]   PHYSICIAN:  Jimmye Norman III, M.D.               DATE OF BIRTH:  Oct 17, 1955   DATE OF ADMISSION:  07/11/2002  DATE OF DISCHARGE:                                HISTORY & PHYSICAL   PREOPERATIVE HISTORY AND PHYSICAL:   IDENTIFICATION/CHIEF COMPLAINT:  The patient is a 55 year old male  complaining of abdominal pain in the right lower quadrant and a CT diagnosis  of acute appendicitis.   HISTORY OF PRESENT ILLNESS:  I was paged earlier this evening by Dr. Daisy Floro with information about a patient who had been sent over to [facility] for a CT scan of the abdomen.  The results of the CT scan came back  as acute appendicitis and a surgical consultation was obtained.  The only  workup the patient has had up to that point was physical examination and a  urinalysis which was apparently negative in the primary care's office.  He  had not had a CBC.   I saw the patient in the emergency room where he reported to me a history of  abdominal pain starting on Tuesday which had completely resolved up until 3  o'clock this evening when he was awakened from sleep in a reclining chair  with severe abdominal pain in the right lower quadrant sort of in his  suprapubic area and migrating to the right lower quadrant.  He had some  nausea, no vomiting, and did have a normal appetite at that time however, he  has not eaten anything for several hours.   PAST MEDICAL HISTORY:  His past medical history is unremarkable.  He has no  history of high blood pressure, hypertension, cardiac disease, renal  disease, or pulmonary disease.   PAST SURGICAL HISTORY:  None.   SOCIAL HISTORY:  He is a smoker but takes no illegal drugs.  Unknown alcohol  ingestion.   FAMILY HISTORY:   Unremarkable.   PHYSICAL EXAMINATION:  VITAL SIGNS:  He is afebrile; the vital signs are  stable.  GENERAL:  He is a thin male in no acute distress.  HEENT:  He is normocephalic and atraumatic and anicteric.  NECK:  Supple.  CHEST:  Clear.  ABDOMEN:  Quiet abdomen but he does have bowel sounds with tenderness in the  right lower quadrant with guarding and rebound in that area; no Rovsing  sign; no cough tenderness but does have movement and shake tenderness.  RECTAL:  Very increased rectal tone; no gross blood; could not palpate any  pelvic masses.  However, the examination was limited by the patient's  resistance.   LABORATORY STUDIES:  Laboratory studies are all pending at this time except  for  an EKG which shows possibly an old septal infarct.   IMPRESSION:  Clinically, the patient has evidence of acute appendicitis.  That coupled with laboratory studies to confirm that meaning an elevated  white count or one with left shift should suffice for the diagnosis.  We  have additional information of a CT scan which demonstrates acute  appendicitis in this man.   PLAN:  The plan will be to take him to the operating room for an  appendectomy.  This will be done under open procedure.  The patient  understands the risks and benefits of this especially the open procedure  versus laparoscopic procedure.                                               Kathrin Ruddy, M.D.    JW/MEDQ  D:  07/11/2002  T:  07/13/2002  Job:  [id number]

## 2010-12-09 NOTE — Op Note (Signed)
[name], [name]                            ACCOUNT NO.:  [id number]   MEDICAL RECORD NO.:  [id number]                   PATIENT TYPE:  INP   LOCATION:  5731                                 FACILITY:  [facility]   PHYSICIAN:  Jimmye Norman III, M.D.               DATE OF BIRTH:  [birth year]   DATE OF PROCEDURE:  07/12/2002  DATE OF DISCHARGE:  07/12/2002                                 OPERATIVE REPORT   PREOPERATIVE DIAGNOSES:  Acute appendicitis.   POSTOPERATIVE DIAGNOSES:  Acute appendicitis without rupture.   PROCEDURE:  Appendectomy.   SURGEON:  Jimmye Norman, M.D.   ASSISTANT:  None.   ANESTHESIA:  General endotracheal.   ESTIMATED BLOOD LOSS:  Less than 10 cc.   COMPLICATIONS:  None.   CONDITION:  Stable.   SPECIMENS:  Appendix.   INDICATIONS FOR PROCEDURE:  The patient is a 55 year old male with abdominal  pain in the right lower quadrant who now comes in for acute appendicitis.   FINDINGS:  The patient had an acutely inflamed and distended abdomen without  evidence of perforation.  There was no abscess cavity noted.   OPERATION:  The patient was taken to the operating room and placed on the  table in the supine position.  After an adequate endotracheal anesthetic was  administered, he was prepped and draped in the usual sterile manner in the  midline and the right lower quadrant.   A right lower quadrant transverse skin incision approximately 4.5 cm long  was made using #10 blade, and taken down into the subcutaneous tissue and  then down to the fascia of the external oblique which was split along its  fibers, exposing the internal oblique and transversus abdominis muscles.  These muscles were split along their fibers exposing the peritoneum  overlying the cecum.  We opened the peritoneum using Metzenbaum scissors,  exposed the cecum and mobilized the appendix into the wound.   The appendix was attached superolaterally to the abdominal wall.  This had  to be  taken down using electrocautery.  We also used hemostat clamps and 2-0  and 3-0 Vicryl ties in order to take down the lateral attachments and also  the attachments to the mesoappendix.  We took the mesoappendix down to the  base of the cecum where we caught the appendix with a straight hemostat  clamp, tied it off with an 0 chromic tie, then transected 1 cm distally to  the tie, and cauterized any exposed transected mucosal edge.  We then  inverted that edge into the base of the cecum using a pursestring and figure-  of-eight stitch of 2-0 silk.   Once the appendiceal base was inverted, we passed the cecum back into the  peritoneal cavity.  We irrigated with saline up to approximately 250 cc, and  then closed the peritoneum using a running 3-0 PDS suture.  The internal  oblique and transversus abdominis muscles were reapproximated using figure-  of-eight stitch of 0 PDS.  The external oblique was closed using a 0 PDS  suture.  The Scarpa's fascia was reapproximated using three interrupted  sutures of 3-0 Vicryl, and then the skin was closed loosely with three  interrupted vertical mattress sutures of 4-0 nylon.  Intervening quarter-  inch Steri-Strips were placed.  A sterile dressing was applied including  antibiotic ointment.                                               Kathrin Ruddy, M.D.    JW/MEDQ  D:  07/12/2002  T:  07/13/2002  Job:  [id number]

## 2011-04-19 LAB — BASIC METABOLIC PANEL
BUN: 7
BUN: 7
CO2: 24
CO2: 24
Calcium: 8.5
Calcium: 8.9
Chloride: 105
Creatinine, Ser: 0.86
Creatinine, Ser: 0.91
GFR calc Af Amer: >60
GFR calc non Af Amer: >60
Glucose, Bld: 92
Sodium: 138

## 2011-04-19 LAB — CBC
HCT: 36.6 — ABNORMAL LOW
Hemoglobin: 13.3
MCHC: 34
MCV: 87.8
Platelets: 274
Platelets: 301
RBC: 4.14 — ABNORMAL LOW
RBC: 4.17 — ABNORMAL LOW
RDW: 13.8
WBC: 15.3 — ABNORMAL HIGH
WBC: 15.7 — ABNORMAL HIGH

## 2011-04-19 LAB — COMPREHENSIVE METABOLIC PANEL
ALT: 40
AST: 100 — ABNORMAL HIGH
Albumin: 2.7 — ABNORMAL LOW
BUN: 7
CO2: 21
CO2: 25
Calcium: 8.3 — ABNORMAL LOW
Chloride: 104
Chloride: 106
Creatinine, Ser: 1.02
GFR calc Af Amer: >60
GFR calc non Af Amer: >60
GFR calc non Af Amer: >60
Glucose, Bld: 144 — ABNORMAL HIGH
Sodium: 138
Total Bilirubin: 0.2 — ABNORMAL LOW
Total Bilirubin: 0.6

## 2011-04-19 LAB — POCT I-STAT, CHEM 8
Calcium, Ion: 1.15
Glucose, Bld: 142 — ABNORMAL HIGH
HCT: 38 — ABNORMAL LOW
Hemoglobin: 12.9 — ABNORMAL LOW
Potassium: 3.2 — ABNORMAL LOW
TCO2: 24

## 2011-04-19 LAB — LIPID PANEL
HDL: 29 — ABNORMAL LOW
LDL Cholesterol: 107 — ABNORMAL HIGH
Triglycerides: 87

## 2011-04-19 LAB — CARDIAC PANEL(CRET KIN+CKTOT+MB+TROPI)
CK, MB: 107 — ABNORMAL HIGH
Total CK: 1717 — ABNORMAL HIGH
Troponin I: 0.28 — ABNORMAL HIGH

## 2011-04-19 LAB — PROTIME-INR: INR: 1

## 2011-04-19 LAB — TSH: TSH: 0.868

## 2011-04-21 LAB — BASIC METABOLIC PANEL
BUN: 5 — ABNORMAL LOW
BUN: 6
BUN: 7
CO2: 24
CO2: 24
Calcium: 9.1
Calcium: 8.7
Calcium: 9.1
Calcium: 9.1
Chloride: 109
Chloride: 110
Creatinine, Ser: 0.77
Creatinine, Ser: 0.84
Creatinine, Ser: 0.88
GFR calc Af Amer: >60
GFR calc Af Amer: >60
GFR calc non Af Amer: >60
GFR calc non Af Amer: >60
Glucose, Bld: 97
Glucose, Bld: 101 — ABNORMAL HIGH
Potassium: 3.4 — ABNORMAL LOW
Sodium: 141

## 2011-04-21 LAB — CBC
HCT: 41.3
HCT: 36.7 — ABNORMAL LOW
HCT: 39.7
Hemoglobin: 13.8
MCHC: 33
MCHC: 33.6
MCV: 89.7
MCV: 91.2
Platelets: 258
Platelets: 272
RBC: 4.56
RBC: 4.09 — ABNORMAL LOW
RDW: 14.9
RDW: 14.3
RDW: 14.3
WBC: 13.1 — ABNORMAL HIGH
WBC: 12.1 — ABNORMAL HIGH

## 2011-04-21 LAB — CARDIAC PANEL(CRET KIN+CKTOT+MB+TROPI)
CK, MB: 1.7
Relative Index: INVALID
Relative Index: INVALID
Troponin I: 0.03
Troponin I: 0.02

## 2011-04-21 LAB — PROTIME-INR
INR: 1
INR: 1.1
Prothrombin Time: 14.2

## 2015-06-21 DIAGNOSIS — E785 Hyperlipidemia, unspecified: Secondary | ICD-10-CM | POA: Insufficient documentation

## 2015-06-21 DIAGNOSIS — K580 Irritable bowel syndrome with diarrhea: Secondary | ICD-10-CM | POA: Insufficient documentation

## 2015-07-02 ENCOUNTER — Encounter: Payer: Self-pay | Admitting: *Deleted

## 2015-07-02 ENCOUNTER — Other Ambulatory Visit: Payer: Self-pay | Admitting: *Deleted

## 2015-07-05 ENCOUNTER — Encounter: Payer: Self-pay | Admitting: *Deleted

## 2015-07-05 ENCOUNTER — Ambulatory Visit (INDEPENDENT_AMBULATORY_CARE_PROVIDER_SITE_OTHER): Payer: Managed Care, Other (non HMO) | Admitting: Cardiology

## 2015-07-05 ENCOUNTER — Encounter: Payer: Self-pay | Admitting: Cardiology

## 2015-07-05 VITALS — BP 133/73 | HR 62 | Ht 69.0 in | Wt 156.0 lb

## 2015-07-05 DIAGNOSIS — E782 Mixed hyperlipidemia: Secondary | ICD-10-CM

## 2015-07-05 DIAGNOSIS — I25709 Atherosclerosis of coronary artery bypass graft(s), unspecified, with unspecified angina pectoris: Secondary | ICD-10-CM | POA: Diagnosis not present

## 2015-07-05 DIAGNOSIS — Z720 Tobacco use: Secondary | ICD-10-CM

## 2015-07-05 DIAGNOSIS — Z136 Encounter for screening for cardiovascular disorders: Secondary | ICD-10-CM | POA: Diagnosis not present

## 2015-07-05 DIAGNOSIS — I10 Essential (primary) hypertension: Secondary | ICD-10-CM

## 2015-07-05 MED ORDER — ASPIRIN EC 81 MG PO TBEC: 81.0000 mg | DELAYED_RELEASE_TABLET | Freq: Every day | ORAL | Status: DC

## 2015-07-05 NOTE — Patient Instructions (Signed)
Your physician has recommended you make the following change in your medication:  Start aspirin 81 mg daily. Continue all other medications the same. Your physician has requested that you have an echocardiogram. Echocardiography is a painless test that uses sound waves to create images of your heart. It provides your doctor with information about the size and shape of your heart and how well your heart's chambers and valves are working. This procedure takes approximately one hour. There are no restrictions for this procedure. Your physician has requested that you have en exercise stress myoview. For further information please visit [email or web address]. Please follow instruction sheet, as given. Your physician recommends that you schedule a follow-up appointment in: 6 months. You will receive a reminder letter in the mail in about 4 months reminding you to call and schedule your appointment. If you don't receive this letter, please contact our office.

## 2015-07-05 NOTE — Progress Notes (Signed)
Cardiology Office Note  Date: 07/05/2015   ID: [name], DOB 07/16/1956, MRN [id number]  PCP: Josue HectorNYLAND,LEONARD ROBERT, MD  Primary Cardiologist: Nona DellSamuel McDowell, MD   Chief Complaint  Patient presents with  . Coronary Artery Disease    History of Present Illness: [name] is a 59 y.o. male referred to establish cardiology follow-up by Ms. Hemberg NP with Dr. Lysbeth GalasNyland. Mr. Jennette Kettleeal is a former patient of Dr. Riley KillStuckey, not seen for several years. I reviewed his records and updated the chart. He is here today with his wife. 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 He reports dyspnea with walking up inclines or carrying heavy objects, more so than noted several years ago. Also what sounds like angina symptoms, particularly when he gets emotionally upset. He does state that he gets "stressed out" easily.  Cardiac history includes inferior wall infarct back in 2009 with subsequent interventions to the RCA as detailed below, last one being a DES to the RCA in August 2009. He did have residual left circumflex and LAD disease that was managed medically. He has not had any recent follow-up ischemic testing.  ECG today shows sinus rhythm with evidence of old inferior infarct and nonspecific ST changes.  He has not been taking aspirin regularly. We discussed this today.  He has a long-standing history of tobacco abuse, has not been able to stop smoking.  Past Medical History  Diagnosis Date  . Essential hypertension   . CAD (coronary artery disease)     BMS to RCA May 2009, cutting balloon angioplasty for restenosis July 2009, then DES to RCA  August 2009.  Marland Kitchen. Hypercholesteremia   . IBS (irritable bowel syndrome)   . History of inferior wall myocardial infarction     May 2009    Past Surgical History  Procedure Laterality Date  . Foot surgery     . Appendectomy      Current Outpatient Prescriptions  Medication Sig Dispense Refill  . hyoscyamine (LEVBID) 0.375 MG 12 hr tablet Take 0.375 mg by mouth every 12 (twelve) hours as needed.    . pravastatin (PRAVACHOL) 40 MG tablet Take 40 mg by mouth at bedtime.    Marland Kitchen. aspirin EC 81 MG tablet Take 1 tablet (81 mg total) by mouth daily. 90 tablet 3   No current facility-administered medications for this visit.   Allergies:  Review of patient's allergies indicates no known allergies.   Social History: The patient  reports that he has been smoking Cigarettes.  He does not have any smokeless tobacco history on file. He reports that he does not drink alcohol or use illicit drugs.   Family History: The patient's family history includes Heart attack in his father.   ROS:  Please see the history of present illness. Otherwise, complete review of systems is positive for neuropathic neck pain and arm pain. Mild ankle edema when he has been up on his feet all day.  All other systems are reviewed and negative.   Physical Exam: VS:  BP 133/73 mmHg  Pulse 62  Ht 5\' 9"  (1.753 m)  Wt 156 lb (70.761 kg)  BMI 23.03 kg/m2  SpO2 98%, BMI Body mass index is 23.03 kg/(m^2).  Wt Readings from Last 3 Encounters:  07/05/15 156 lb (70.761 kg)  01/28/09 158 lb (71.668 kg)    General: Patient  appears comfortable at rest. HEENT: Conjunctiva and lids normal, oropharynx clear with poor dentition. Neck: Supple, no elevated JVP or carotid bruits, no thyromegaly. Lungs: Diminished breath sounds without labored breathing, nonlabored breathing at rest. Cardiac: Regular rate and rhythm, soft S4, no significant systolic murmur, no pericardial rub. Abdomen: Soft, nontender, bowel sounds present, no guarding or rebound. Extremities: No pitting edema, distal pulses 2+. Skin: Warm and dry. Musculoskeletal: No kyphosis. Neuropsychiatric: Alert and oriented x3, affect grossly appropriate.  ECG: ECG is ordered  today.  Recent Labwork:  December 2015: BUN 9, creatinine 0.9, potassium 4.3, AST 25, ALT 18 , cholesterol 140, triglycerides 77, HDL 43, LDL 82  Assessment and Plan:  1. CAD status post inferior wall infarct back in 2009, status post percutaneous interventions to the RCA, most recently DES in 2009. He has not had regular cardiology follow-up for several years. ECG is consistent with old inferior wall infarct. He does report a decline in stamina with other symptoms including dyspnea on exertion with inclines and probable angina as well. I recommended that he start an aspirin 80 mg daily, and we will obtain a follow-up ischemic evaluation, beginning with exercise Myoview to assess ischemic burden. An echocardiogram will also be obtained, particularly with reported mild ankle edema at times.  2. Long-standing history of tobacco abuse. He has not been able to quit. Smoking cessation has been discussed.  3. Hyperlipidemia, on Pravachol. LDL was 82 last year. He is following with Dr. Lysbeth Galas.  4. Essential hypertension by history, not on any antihypertensive medications at this point.  Current medicines were reviewed with the patient today.   Orders Placed This Encounter  Procedures  . NM Myocar Multi W/Spect W/Wall Motion / EF  . Myocardial Perfusion Imaging  . EKG 12-Lead  . ECHOCARDIOGRAM COMPLETE    Disposition: FU with me in 6 weeks.   Signed, Jonelle Sidle, MD, [facility] 07/05/2015 3:21 PM    [facility] [address] [place], [place], [place] [address] Phone: [phone]; Fax: [phone]

## 2015-07-12 ENCOUNTER — Inpatient Hospital Stay (HOSPITAL_COMMUNITY): Admission: RE | Admit: 2015-07-12 | Payer: Managed Care, Other (non HMO) | Source: Ambulatory Visit

## 2015-07-12 ENCOUNTER — Encounter (HOSPITAL_COMMUNITY): Payer: Self-pay

## 2015-07-12 ENCOUNTER — Encounter (HOSPITAL_COMMUNITY)
Admission: RE | Admit: 2015-07-12 | Discharge: 2015-07-12 | Disposition: A | Discharge status: Home / Self Care | Payer: Managed Care, Other (non HMO) | Source: Ambulatory Visit | Attending: Cardiology | Admitting: Cardiology

## 2015-07-12 ENCOUNTER — Ambulatory Visit (HOSPITAL_COMMUNITY)
Admission: RE | Admit: 2015-07-12 | Discharge: 2015-07-12 | Disposition: A | Discharge status: Home / Self Care | Payer: Managed Care, Other (non HMO) | Source: Ambulatory Visit | Attending: Cardiology | Admitting: Cardiology

## 2015-07-12 DIAGNOSIS — I25709 Atherosclerosis of coronary artery bypass graft(s), unspecified, with unspecified angina pectoris: Secondary | ICD-10-CM

## 2015-07-12 DIAGNOSIS — I340 Nonrheumatic mitral (valve) insufficiency: Secondary | ICD-10-CM | POA: Insufficient documentation

## 2015-07-12 DIAGNOSIS — R9439 Abnormal result of other cardiovascular function study: Secondary | ICD-10-CM | POA: Insufficient documentation

## 2015-07-12 DIAGNOSIS — I2510 Atherosclerotic heart disease of native coronary artery without angina pectoris: Secondary | ICD-10-CM | POA: Diagnosis not present

## 2015-07-12 LAB — NM MYOCAR MULTI W/SPECT W/WALL MOTION / EF
CHL CUP RESTING HR STRESS: 79 {beats}/min
CHL RATE OF PERCEIVED EXERTION: 15
CSEPED: 5 min
CSEPEW: 7 METS
CSEPPHR: 144 {beats}/min
Exercise duration (sec): 9 s
LV dias vol: 144 mL
LVSYSVOL: 85 mL
MPHR: 162 {beats}/min
Percent HR: 88 %
RATE: 0
SDS: 6
SRS: 14
SSS: 20
TID: 1.18

## 2015-07-12 MED ORDER — TECHNETIUM TC 99M SESTAMIBI - CARDIOLITE
30.0000 | Freq: Once | INTRAVENOUS | Status: AC | PRN
  Administered 2015-07-12: 11:00:00 30 via INTRAVENOUS

## 2015-07-12 MED ORDER — SODIUM CHLORIDE 0.9 % IJ SOLN
INTRAMUSCULAR | Status: AC
  Administered 2015-07-12: 10 mL via INTRAVENOUS
  Filled 2015-07-12: qty 3

## 2015-07-12 MED ORDER — REGADENOSON 0.4 MG/5ML IV SOLN
INTRAVENOUS | Status: AC
  Filled 2015-07-12: qty 5

## 2015-07-12 MED ORDER — TECHNETIUM TC 99M SESTAMIBI GENERIC - CARDIOLITE
10.0000 | Freq: Once | INTRAVENOUS | Status: AC | PRN
  Administered 2015-07-12: 10 via INTRAVENOUS

## 2017-04-25 IMAGING — NM NM MYOCAR MULTI W/SPECT W/WALL MOTION & EF
2 series · 12 of 12 positions shown · non-contrast
Comparison: none

[Series 1: rest · 8.28mm/px · 6 of 64 frames shown]
[frame 6/64]
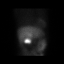
[frame 16/64]
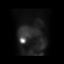
[frame 27/64]
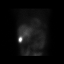
[frame 38/64]
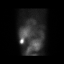
[frame 48/64]
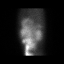
[frame 59/64]
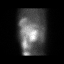

[Series 2: stress gated · 8.28mm/px · 6 of 64 frames shown]
[frame 6/64]
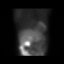
[frame 16/64]
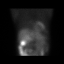
[frame 27/64]
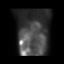
[frame 38/64]
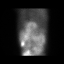
[frame 48/64]
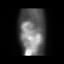
[frame 59/64]
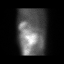

[12 of 12 positions shown; findings below may reference images not displayed]

Canned report from images found in remote index.

Refer to host system for actual result text.

## 2017-07-17 DIAGNOSIS — M25561 Pain in right knee: Secondary | ICD-10-CM | POA: Diagnosis not present

## 2017-07-17 DIAGNOSIS — Z955 Presence of coronary angioplasty implant and graft: Secondary | ICD-10-CM | POA: Diagnosis not present

## 2017-07-17 DIAGNOSIS — M2391 Unspecified internal derangement of right knee: Secondary | ICD-10-CM | POA: Diagnosis not present

## 2017-07-17 DIAGNOSIS — E7800 Pure hypercholesterolemia, unspecified: Secondary | ICD-10-CM | POA: Diagnosis not present

## 2017-07-17 DIAGNOSIS — Z7982 Long term (current) use of aspirin: Secondary | ICD-10-CM | POA: Diagnosis not present

## 2017-07-17 DIAGNOSIS — Z79899 Other long term (current) drug therapy: Secondary | ICD-10-CM | POA: Diagnosis not present

## 2017-07-18 DIAGNOSIS — M25561 Pain in right knee: Secondary | ICD-10-CM | POA: Diagnosis not present

## 2017-07-26 DIAGNOSIS — M25561 Pain in right knee: Secondary | ICD-10-CM | POA: Diagnosis not present

## 2017-07-31 DIAGNOSIS — M25561 Pain in right knee: Secondary | ICD-10-CM | POA: Diagnosis not present

## 2017-08-02 DIAGNOSIS — M25561 Pain in right knee: Secondary | ICD-10-CM | POA: Diagnosis not present

## 2017-08-16 ENCOUNTER — Ambulatory Visit (HOSPITAL_BASED_OUTPATIENT_CLINIC_OR_DEPARTMENT_OTHER): Admit: 2017-08-16 | Payer: Managed Care, Other (non HMO) | Admitting: Orthopaedic Surgery

## 2017-08-16 ENCOUNTER — Encounter (HOSPITAL_BASED_OUTPATIENT_CLINIC_OR_DEPARTMENT_OTHER): Payer: Self-pay

## 2017-08-16 SURGERY — ARTHROSCOPY, KNEE, WITH MEDIAL MENISCECTOMY
Anesthesia: Choice | Laterality: Right

## 2017-08-22 NOTE — Progress Notes (Signed)
Cardiology Office Note  Date: 08/23/2017   ID: [name], DOB 03/17/1956, MRN [id number]  PCP: Joette CatchingNyland, Leonard, MD  Primary Cardiologist: Nona DellSamuel Jazelle Achey, MD   Chief Complaint  Patient presents with  . Preoperative cardiac evaluation    History of Present Illness: Billy SettleRandy L Drapeau is a 62 y.o. male last seen in December 2016.  He presents to the office for preoperative cardiac evaluation.  He is being considered for right knee arthroscopy by Dr. Everardo PacificVarkey.  He reports progressive arthritic pain in his right knee, limiting ambulation.  From a cardiac perspective he does not report any significant angina symptoms.  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  He describes activities meeting or exceeding 4 METS.  Cardiac testing from 2016 is outlined below.  LVEF was approximately 45% with perfusion imaging consistent with inferior/inferolateral scar and mild peri-infarct ischemia.  I personally reviewed his ECG today which shows sinus tachycardia with evidence of old inferolateral infarct and PVC.  He continues on aspirin and statin therapy.  Has not required any nitroglycerin use.  Past Medical History:  Diagnosis Date  . CAD (coronary artery disease)    BMS to RCA May 2009, cutting balloon angioplasty for restenosis July 2009, then DES to RCA  August 2009.  Marland Kitchen. Essential hypertension   . History of inferior wall myocardial infarction    May 2009  . Hypercholesteremia   . IBS (irritable bowel syndrome)     Past Surgical History:  Procedure Laterality Date  . APPENDECTOMY    . FOOT SURGERY      Current Outpatient Medications  Medication Sig Dispense Refill  . aspirin EC 81 MG tablet Take 1 tablet (81 mg total) by mouth daily. 90 tablet 3  . hyoscyamine (LEVBID) 0.375 MG 12 hr tablet Take 0.375 mg by mouth every 12 (twelve) hours as needed.    . pravastatin  (PRAVACHOL) 40 MG tablet Take 40 mg by mouth at bedtime.     No current facility-administered medications for this visit.    Allergies:  Patient has no known allergies.   Social History: The patient  reports that he has been smoking cigarettes.  he has never used smokeless tobacco. He reports that he does not drink alcohol or use drugs.   ROS:  Please see the history of present illness. Otherwise, complete review of systems is positive for arthritic right knee pain.  All other systems are reviewed and negative.   Physical Exam: VS:  BP 138/88   Pulse 95   Ht 5\' 9"  (1.753 m)   Wt 153 lb (69.4 kg)   SpO2 97%   BMI 22.59 kg/m , BMI Body mass index is 22.59 kg/m.  Wt Readings from Last 3 Encounters:  08/23/17 153 lb (69.4 kg)  07/05/15 156 lb (70.8 kg)    General: Patient appears comfortable at rest. HEENT: Conjunctiva and lids normal, oropharynx clear. Neck: Supple, no elevated JVP or carotid bruits, no thyromegaly. Lungs: Clear to auscultation, nonlabored breathing at rest. Cardiac: Regular rate and rhythm, no S3 or significant systolic murmur, no pericardial rub. Abdomen: Soft, nontender, bowel sounds present. Extremities: No pitting edema, distal pulses 2+. Skin: Warm and dry. Musculoskeletal: No kyphosis.  Brace on right knee. Neuropsychiatric: Alert and oriented x3, affect grossly appropriate.  ECG: I personally reviewed the tracing from 07/05/2015 which showed sinus  rhythm with old inferior infarct pattern and nonspecific ST changes.  Recent Labwork:  December 2016: BUN 10, creatinine 0.78, potassium 4.3, AST 21, ALT 16, hemoglobin 13.8, platelets 289, cholesterol 161, triglycerides 116, HDL 38, LDL 100  Other Studies Reviewed Today:  Exercise Myoview 07/12/2015:  Abnormal ST segment depression noted in leads V5 and V6 toward peak heart rate in the absence of chest pain. No significant arrhythmias.  Large perfusion defect in the inferior and inferolateral wall  consistent with scar from previous infarct. There is partial reversibility in the lateral portion of this defect consistent with mild peri-infarct ischemia.  This is an intermediate risk study.  Nuclear stress EF: 41%.  Echocardiogram 07/12/2015: Study Conclusions  - Left ventricle: The cavity size was normal. Wall thickness was   normal. Systolic function was mildly to moderately reduced. The   estimated ejection fraction was 45%. There is akinesis of the   inferolateral and inferior myocardium. Doppler parameters are   consistent with abnormal left ventricular relaxation (grade 1   diastolic dysfunction). - Mitral valve: Mildly thickened leaflets . There was trivial   regurgitation. - Right atrium: Central venous pressure (est): 3 mm Hg. - Tricuspid valve: There was trivial regurgitation. - Pulmonary arteries: Systolic pressure could not be accurately   estimated. - Pericardium, extracardiac: There was no pericardial effusion.  Impressions:  - Normal LV wall thickness with LVEF approximately 45%. There is   inferior and inferolateral akinesis consistent with ischemic   cardiomyopathy. Grade 1 diastolic dysfunction. Mildly thickened   mitral leaflets with trivial mitral regurgitation.  Assessment and Plan:  1.  Preoperative cardiac assessment in a 62 year old male with history of CAD status post previous inferolateral infarct in 2009 with BMS and DES to the RCA.  Ischemic testing from within the last 2.5 years showed a fairly large region of inferior/inferolateral scar with mild peri-infarct ischemia.  He does not report any angina at this time and describes activities meeting or exceeding 4 METs.  ECG is consistent with his old infarct.  At this point no further cardiac testing is planned.  He should be able to proceed with planned knee arthroscopy at an acceptable perioperative cardiac risk.  2.  Hyperlipidemia, continues on Pravachol with follow-up per Dr. Lysbeth Galas.  3.   Essential hypertension, currently diet managed.  4.  Ongoing tobacco abuse.  He has not been motivated to quit.  Current medicines were reviewed with the patient today.   Orders Placed This Encounter  Procedures  . EKG 12-Lead    Disposition: Follow-up in 1 year.  Signed, Jonelle Sidle, MD, [facility] 08/23/2017 1:46 PM    [facility] at [facility] [address] [place], [place] [address] Phone: [phone]; Fax: [phone]

## 2017-08-23 ENCOUNTER — Ambulatory Visit (INDEPENDENT_AMBULATORY_CARE_PROVIDER_SITE_OTHER): Payer: BLUE CROSS/BLUE SHIELD | Admitting: Cardiology

## 2017-08-23 ENCOUNTER — Encounter: Payer: Self-pay | Admitting: Cardiology

## 2017-08-23 VITALS — BP 138/88 | HR 95 | Ht 69.0 in | Wt 153.0 lb

## 2017-08-23 DIAGNOSIS — I10 Essential (primary) hypertension: Secondary | ICD-10-CM | POA: Diagnosis not present

## 2017-08-23 DIAGNOSIS — I25119 Atherosclerotic heart disease of native coronary artery with unspecified angina pectoris: Secondary | ICD-10-CM

## 2017-08-23 DIAGNOSIS — E782 Mixed hyperlipidemia: Secondary | ICD-10-CM

## 2017-08-23 DIAGNOSIS — Z720 Tobacco use: Secondary | ICD-10-CM

## 2017-08-23 DIAGNOSIS — Z01810 Encounter for preprocedural cardiovascular examination: Secondary | ICD-10-CM

## 2017-08-23 NOTE — Patient Instructions (Signed)
Medication Instructions:  Your physician recommends that you continue on your current medications as directed. Please refer to the Current Medication list given to you today.   Labwork: NONE  Testing/Procedures: NONE  Follow-Up: Your physician wants you to follow-up in: 1 YEAR .  You will receive a reminder letter in the mail two months in advance. If you don't receive a letter, please call our office to schedule the follow-up appointment.   Any Other Special Instructions Will Be Listed Below (If Applicable). WE WILL SEND IN YOUR CLEARANCE FORM   If you need a refill on your cardiac medications before your next appointment, please call your pharmacy.

## 2017-08-24 DIAGNOSIS — M2341 Loose body in knee, right knee: Secondary | ICD-10-CM

## 2017-08-24 DIAGNOSIS — S83249A Other tear of medial meniscus, current injury, unspecified knee, initial encounter: Secondary | ICD-10-CM

## 2017-08-24 HISTORY — DX: Loose body in knee, right knee: M23.41

## 2017-08-24 HISTORY — DX: Other tear of medial meniscus, current injury, unspecified knee, initial encounter: S83.249A

## 2017-09-06 ENCOUNTER — Ambulatory Visit (HOSPITAL_BASED_OUTPATIENT_CLINIC_OR_DEPARTMENT_OTHER): Admit: 2017-09-06 | Payer: BLUE CROSS/BLUE SHIELD | Admitting: Orthopaedic Surgery

## 2017-09-06 ENCOUNTER — Encounter (HOSPITAL_BASED_OUTPATIENT_CLINIC_OR_DEPARTMENT_OTHER): Payer: Self-pay

## 2017-09-06 SURGERY — ARTHROSCOPY, KNEE, WITH MEDIAL MENISCECTOMY
Anesthesia: Choice | Laterality: Right

## 2017-09-07 ENCOUNTER — Other Ambulatory Visit: Payer: Self-pay

## 2017-09-07 ENCOUNTER — Encounter (HOSPITAL_BASED_OUTPATIENT_CLINIC_OR_DEPARTMENT_OTHER): Payer: Self-pay | Admitting: *Deleted

## 2017-09-13 ENCOUNTER — Encounter (HOSPITAL_BASED_OUTPATIENT_CLINIC_OR_DEPARTMENT_OTHER)
Admission: RE | Disposition: A | Discharge status: Home / Self Care | Payer: Self-pay | Source: Ambulatory Visit | Attending: Orthopaedic Surgery

## 2017-09-13 ENCOUNTER — Ambulatory Visit (HOSPITAL_BASED_OUTPATIENT_CLINIC_OR_DEPARTMENT_OTHER): Payer: BLUE CROSS/BLUE SHIELD | Admitting: Anesthesiology

## 2017-09-13 ENCOUNTER — Other Ambulatory Visit: Payer: Self-pay

## 2017-09-13 ENCOUNTER — Encounter (HOSPITAL_BASED_OUTPATIENT_CLINIC_OR_DEPARTMENT_OTHER): Payer: Self-pay | Admitting: *Deleted

## 2017-09-13 ENCOUNTER — Ambulatory Visit (HOSPITAL_BASED_OUTPATIENT_CLINIC_OR_DEPARTMENT_OTHER)
Admission: RE | Admit: 2017-09-13 | Discharge: 2017-09-13 | Disposition: A | Discharge status: Home / Self Care | Payer: BLUE CROSS/BLUE SHIELD | Source: Ambulatory Visit | Attending: Orthopaedic Surgery | Admitting: Orthopaedic Surgery

## 2017-09-13 DIAGNOSIS — S83231A Complex tear of medial meniscus, current injury, right knee, initial encounter: Secondary | ICD-10-CM | POA: Insufficient documentation

## 2017-09-13 DIAGNOSIS — K589 Irritable bowel syndrome without diarrhea: Secondary | ICD-10-CM | POA: Diagnosis not present

## 2017-09-13 DIAGNOSIS — X58XXXA Exposure to other specified factors, initial encounter: Secondary | ICD-10-CM | POA: Insufficient documentation

## 2017-09-13 DIAGNOSIS — Z7982 Long term (current) use of aspirin: Secondary | ICD-10-CM | POA: Insufficient documentation

## 2017-09-13 DIAGNOSIS — M1711 Unilateral primary osteoarthritis, right knee: Secondary | ICD-10-CM | POA: Insufficient documentation

## 2017-09-13 DIAGNOSIS — I252 Old myocardial infarction: Secondary | ICD-10-CM | POA: Diagnosis not present

## 2017-09-13 DIAGNOSIS — Z79899 Other long term (current) drug therapy: Secondary | ICD-10-CM | POA: Insufficient documentation

## 2017-09-13 DIAGNOSIS — F17210 Nicotine dependence, cigarettes, uncomplicated: Secondary | ICD-10-CM | POA: Diagnosis not present

## 2017-09-13 DIAGNOSIS — M2341 Loose body in knee, right knee: Secondary | ICD-10-CM | POA: Diagnosis not present

## 2017-09-13 DIAGNOSIS — S83241A Other tear of medial meniscus, current injury, right knee, initial encounter: Secondary | ICD-10-CM | POA: Diagnosis not present

## 2017-09-13 DIAGNOSIS — E785 Hyperlipidemia, unspecified: Secondary | ICD-10-CM | POA: Diagnosis not present

## 2017-09-13 DIAGNOSIS — S83206A Unspecified tear of unspecified meniscus, current injury, right knee, initial encounter: Secondary | ICD-10-CM | POA: Diagnosis not present

## 2017-09-13 DIAGNOSIS — I2510 Atherosclerotic heart disease of native coronary artery without angina pectoris: Secondary | ICD-10-CM | POA: Insufficient documentation

## 2017-09-13 HISTORY — PX: KNEE ARTHROSCOPY WITH MEDIAL MENISECTOMY: SHX5651

## 2017-09-13 HISTORY — DX: Other tear of medial meniscus, current injury, unspecified knee, initial encounter: S83.249A

## 2017-09-13 HISTORY — DX: Loose body in knee, right knee: M23.41

## 2017-09-13 HISTORY — PX: CHONDROPLASTY: SHX5177

## 2017-09-13 HISTORY — DX: Hyperlipidemia, unspecified: E78.5

## 2017-09-13 HISTORY — DX: Presence of dental prosthetic device (complete) (partial): Z97.2

## 2017-09-13 SURGERY — ARTHROSCOPY, KNEE, WITH MEDIAL MENISCECTOMY
Anesthesia: General | Site: Knee | Laterality: Right

## 2017-09-13 MED ORDER — MELOXICAM 7.5 MG PO TABS: 7.5000 mg | ORAL_TABLET | Freq: Every day | ORAL | 2 refills | Status: AC

## 2017-09-13 MED ORDER — DEXAMETHASONE SODIUM PHOSPHATE 4 MG/ML IJ SOLN
INTRAMUSCULAR | Status: DC | PRN
  Administered 2017-09-13: 10 mg via INTRAVENOUS

## 2017-09-13 MED ORDER — PHENYLEPHRINE HCL 10 MG/ML IJ SOLN
INTRAMUSCULAR | Status: AC
Start: 2017-09-13 — End: ?
  Filled 2017-09-13: qty 1

## 2017-09-13 MED ORDER — CEFAZOLIN SODIUM-DEXTROSE 2-4 GM/100ML-% IV SOLN
2.0000 g | INTRAVENOUS | Status: AC
  Administered 2017-09-13: 2 g via INTRAVENOUS

## 2017-09-13 MED ORDER — PROPOFOL 10 MG/ML IV BOLUS
INTRAVENOUS | Status: DC | PRN
  Administered 2017-09-13: 150 mg via INTRAVENOUS

## 2017-09-13 MED ORDER — ONDANSETRON HCL 4 MG/2ML IJ SOLN
INTRAMUSCULAR | Status: AC
  Filled 2017-09-13: qty 2

## 2017-09-13 MED ORDER — FENTANYL CITRATE (PF) 100 MCG/2ML IJ SOLN
INTRAMUSCULAR | Status: AC
  Filled 2017-09-13: qty 2

## 2017-09-13 MED ORDER — CEFAZOLIN SODIUM-DEXTROSE 2-4 GM/100ML-% IV SOLN
INTRAVENOUS | Status: AC
  Filled 2017-09-13: qty 100

## 2017-09-13 MED ORDER — ACETAMINOPHEN 500 MG PO TABS: 1000.0000 mg | ORAL_TABLET | Freq: Three times a day (TID) | ORAL | 0 refills | Status: AC

## 2017-09-13 MED ORDER — CHLORHEXIDINE GLUCONATE 4 % EX LIQD: 60.0000 mL | Freq: Once | CUTANEOUS | Status: DC

## 2017-09-13 MED ORDER — PHENYLEPHRINE HCL 10 MG/ML IJ SOLN
INTRAVENOUS | Status: DC | PRN
  Administered 2017-09-13: 50 ug/min via INTRAVENOUS

## 2017-09-13 MED ORDER — MIDAZOLAM HCL 2 MG/2ML IJ SOLN
INTRAMUSCULAR | Status: AC
  Filled 2017-09-13: qty 2

## 2017-09-13 MED ORDER — SCOPOLAMINE 1 MG/3DAYS TD PT72: 1.0000 | MEDICATED_PATCH | Freq: Once | TRANSDERMAL | Status: DC | PRN

## 2017-09-13 MED ORDER — PROPOFOL 10 MG/ML IV BOLUS
INTRAVENOUS | Status: AC
  Filled 2017-09-13: qty 20

## 2017-09-13 MED ORDER — DEXAMETHASONE SODIUM PHOSPHATE 10 MG/ML IJ SOLN
INTRAMUSCULAR | Status: AC
  Filled 2017-09-13: qty 1

## 2017-09-13 MED ORDER — LACTATED RINGERS IV SOLN: INTRAVENOUS | Status: DC

## 2017-09-13 MED ORDER — LIDOCAINE 2% (20 MG/ML) 5 ML SYRINGE
INTRAMUSCULAR | Status: AC
  Filled 2017-09-13: qty 5

## 2017-09-13 MED ORDER — FENTANYL CITRATE (PF) 100 MCG/2ML IJ SOLN: 25.0000 ug | INTRAMUSCULAR | Status: DC | PRN

## 2017-09-13 MED ORDER — MEPERIDINE HCL 25 MG/ML IJ SOLN: 6.2500 mg | INTRAMUSCULAR | Status: DC | PRN

## 2017-09-13 MED ORDER — LIDOCAINE 2% (20 MG/ML) 5 ML SYRINGE
INTRAMUSCULAR | Status: DC | PRN
  Administered 2017-09-13: 50 mg via INTRAVENOUS

## 2017-09-13 MED ORDER — LACTATED RINGERS IV SOLN
INTRAVENOUS | Status: DC
  Administered 2017-09-13: 10:00:00 via INTRAVENOUS

## 2017-09-13 MED ORDER — METOCLOPRAMIDE HCL 5 MG/ML IJ SOLN: 10.0000 mg | Freq: Once | INTRAMUSCULAR | Status: DC | PRN

## 2017-09-13 MED ORDER — ONDANSETRON HCL 4 MG/2ML IJ SOLN
INTRAMUSCULAR | Status: DC | PRN
  Administered 2017-09-13: 4 mg via INTRAVENOUS

## 2017-09-13 MED ORDER — FENTANYL CITRATE (PF) 100 MCG/2ML IJ SOLN
50.0000 ug | INTRAMUSCULAR | Status: DC | PRN
  Administered 2017-09-13: 100 ug via INTRAVENOUS

## 2017-09-13 MED ORDER — OXYCODONE HCL 5 MG PO TABS: ORAL_TABLET | ORAL | 0 refills | Status: AC

## 2017-09-13 MED ORDER — OMEPRAZOLE 20 MG PO CPDR: 20.0000 mg | DELAYED_RELEASE_CAPSULE | Freq: Every day | ORAL | 0 refills | Status: DC

## 2017-09-13 MED ORDER — ONDANSETRON HCL 4 MG PO TABS: 4.0000 mg | ORAL_TABLET | Freq: Three times a day (TID) | ORAL | 1 refills | Status: AC | PRN

## 2017-09-13 MED ORDER — BUPIVACAINE HCL (PF) 0.25 % IJ SOLN
INTRAMUSCULAR | Status: DC | PRN
  Administered 2017-09-13: 30 mL

## 2017-09-13 MED ORDER — MIDAZOLAM HCL 2 MG/2ML IJ SOLN
1.0000 mg | INTRAMUSCULAR | Status: DC | PRN
  Administered 2017-09-13: 2 mg via INTRAVENOUS

## 2017-09-13 SURGICAL SUPPLY — 43 items
APL SKNCLS STERI-STRIP NONHPOA (GAUZE/BANDAGES/DRESSINGS)
BANDAGE ACE 6X5 VEL STRL LF (GAUZE/BANDAGES/DRESSINGS) ×3 IMPLANT
BANDAGE ESMARK 6X9 LF (GAUZE/BANDAGES/DRESSINGS) IMPLANT
BENZOIN TINCTURE PRP APPL 2/3 (GAUZE/BANDAGES/DRESSINGS) ×1 IMPLANT
BLADE SHAVER BONE 5.0MM X 13CM (MISCELLANEOUS)
BLADE SHAVER BONE 5.0X13 (MISCELLANEOUS) IMPLANT
BNDG CMPR 9X6 STRL LF SNTH (GAUZE/BANDAGES/DRESSINGS)
BNDG ESMARK 6X9 LF (GAUZE/BANDAGES/DRESSINGS)
BURR OVAL 8 FLU 4.0MM X 13CM (MISCELLANEOUS)
BURR OVAL 8 FLU 4.0X13 (MISCELLANEOUS) IMPLANT
CHLORAPREP W/TINT 26ML (MISCELLANEOUS) ×4 IMPLANT
CLOSURE WOUND 1/2 X4 (GAUZE/BANDAGES/DRESSINGS)
CUFF TOURNIQUET SINGLE 34IN LL (TOURNIQUET CUFF) ×3 IMPLANT
DISSECTOR 3.5MM X 13CM CVD (MISCELLANEOUS) ×3 IMPLANT
DISSECTOR 4.0MMX13CM CVD (MISCELLANEOUS) ×2 IMPLANT
DRAPE ARTHROSCOPY W/POUCH 90 (DRAPES) ×4 IMPLANT
DRAPE IMP U-DRAPE 54X76 (DRAPES) ×4 IMPLANT
DRAPE INCISE IOBAN 66X45 STRL (DRAPES) ×1 IMPLANT
DRAPE U-SHAPE 47X51 STRL (DRAPES) ×4 IMPLANT
GAUZE SPONGE 4X4 12PLY STRL (GAUZE/BANDAGES/DRESSINGS) ×3 IMPLANT
GAUZE XEROFORM 1X8 LF (GAUZE/BANDAGES/DRESSINGS) ×4 IMPLANT
GLOVE BIOGEL PI IND STRL 7.0 (GLOVE) ×3 IMPLANT
GLOVE BIOGEL PI IND STRL 8 (GLOVE) ×2 IMPLANT
GLOVE BIOGEL PI INDICATOR 7.0 (GLOVE) ×6
GLOVE BIOGEL PI INDICATOR 8 (GLOVE) ×2
GLOVE ECLIPSE 6.5 STRL STRAW (GLOVE) ×3 IMPLANT
GLOVE ECLIPSE 8.0 STRL XLNG CF (GLOVE) ×8 IMPLANT
GLOVE SURG SS PI 6.5 STRL IVOR (GLOVE) ×3 IMPLANT
GOWN STRL REUS W/ TWL LRG LVL3 (GOWN DISPOSABLE) ×3 IMPLANT
GOWN STRL REUS W/TWL LRG LVL3 (GOWN DISPOSABLE) ×8
GOWN STRL REUS W/TWL XL LVL3 (GOWN DISPOSABLE) ×4 IMPLANT
KIT TURNOVER KIT B (KITS) ×4 IMPLANT
MANIFOLD NEPTUNE II (INSTRUMENTS) ×3 IMPLANT
NS IRRIG 1000ML POUR BTL (IV SOLUTION) IMPLANT
PACK ARTHROSCOPY DSU (CUSTOM PROCEDURE TRAY) ×4 IMPLANT
PADDING CAST COTTON 6X4 STRL (CAST SUPPLIES) ×3 IMPLANT
PROBE BIPOLAR ATHRO 135MM 90D (MISCELLANEOUS) IMPLANT
STRIP CLOSURE SKIN 1/2X4 (GAUZE/BANDAGES/DRESSINGS) ×1 IMPLANT
SUT MNCRL AB 3-0 PS2 18 (SUTURE) ×1 IMPLANT
SUT MNCRL AB 4-0 PS2 18 (SUTURE) ×3 IMPLANT
TOWEL OR 17X24 6PK STRL BLUE (TOWEL DISPOSABLE) ×4 IMPLANT
TUBING ARTHROSCOPY IRRIG 16FT (MISCELLANEOUS) ×4 IMPLANT
WATER STERILE IRR 1000ML POUR (IV SOLUTION) ×4 IMPLANT

## 2017-09-13 NOTE — Anesthesia Postprocedure Evaluation (Signed)
Anesthesia Post Note  Patient: [name]  Procedure(s) Performed: RIGHT KNEE ARTHROSCOPY WITH PARTIAL MEDIAL MENISCECTOMY (Right Knee) CHONDROPLASTY RIGHT KNEE (Right Knee)     Patient location during evaluation: PACU Anesthesia Type: General Level of consciousness: awake and alert Pain management: pain level controlled Vital Signs Assessment: post-procedure vital signs reviewed and stable Respiratory status: spontaneous breathing, nonlabored ventilation, respiratory function stable and patient connected to nasal cannula oxygen Cardiovascular status: blood pressure returned to baseline and stable Postop Assessment: no apparent nausea or vomiting Anesthetic complications: no    Last Vitals:  Vitals:   09/13/17 1347 09/13/17 1358  BP:  (!) 144/85  Pulse: 79 94  Resp: 16 16  Temp:  36.5 C  SpO2: 94% 97%    Last Pain:  Vitals:   09/13/17 1358  TempSrc: Oral  PainSc: 0-No pain                 Phillips Groutarignan, Andreu Drudge

## 2017-09-13 NOTE — Anesthesia Procedure Notes (Signed)
Procedure Name: LMA Insertion Performed by: Cleophas Yoak W, CRNA Pre-anesthesia Checklist: Patient identified, Emergency Drugs available, Suction available and Patient being monitored Patient Re-evaluated:Patient Re-evaluated prior to induction Oxygen Delivery Method: Circle system utilized Preoxygenation: Pre-oxygenation with 100% oxygen Induction Type: IV induction Ventilation: Mask ventilation without difficulty LMA: LMA inserted LMA Size: 4.0 Number of attempts: 1 Placement Confirmation: positive ETCO2 Tube secured with: Tape Dental Injury: Teeth and Oropharynx as per pre-operative assessment        

## 2017-09-13 NOTE — H&P (Signed)
PREOPERATIVE H&P  Chief Complaint: LOOSE FOREIGN BODY IN RIGHT KNEE, MENISCUS TEAR IN RIGHT KNEE  HPI: [name] is a 62 y.o. male who presents for preoperative history and physical with a diagnosis of LOOSE FOREIGN BODY IN RIGHT KNEE, MENISCUS TEAR IN RIGHT KNEE. Symptoms are rated as moderate to severe, and have been worsening.  This is significantly impairing activities of daily living.  He has elected for surgical management.   Past Medical History:  Diagnosis Date  . CAD (coronary artery disease)    BMS to RCA May 2009, cutting balloon angioplasty for restenosis July 2009, then DES to RCA  August 2009.  Marland Kitchen History of inferior wall myocardial infarction 11/2007  . Hyperlipidemia   . IBS (irritable bowel syndrome)   . Loose body of right knee 08/2017  . Medial meniscus tear 08/2017   right knee  . Wears dentures    upper   Past Surgical History:  Procedure Laterality Date  . APPENDECTOMY  07/12/2002  . CARDIAC CATHETERIZATION  11/22/2007   stent RCA  . CARDIAC CATHETERIZATION  02/21/2008   restenosis RCA - angioplasty  . CARDIAC CATHETERIZATION  02/28/2008   drug-eluting stent RCA  . FOOT SURGERY Right   . KNEE ARTHROSCOPY Right    Social History   Socioeconomic History  . Marital status: Married    Spouse name: None  . Number of children: None  . Years of education: None  . Highest education level: None  Social Needs  . Financial resource strain: None  . Food insecurity - worry: None  . Food insecurity - inability: None  . Transportation needs - medical: None  . Transportation needs - non-medical: None  Occupational History  . None  Tobacco Use  . Smoking status: Current Every Day Smoker    Years: 30.00    Types: Cigarettes  . Smokeless tobacco: Never Used  . Tobacco comment: 1 pack/3 days  Substance and Sexual Activity  . Alcohol use: No  . Drug use: No  . Sexual activity: None  Other Topics Concern  . None  Social History Narrative  . None    Family History  Problem Relation Age of Onset  . Heart attack Father        h/o CABG and died of massive heart attackat age 59   No Known Allergies Prior to Admission medications   Medication Sig Start Date End Date Taking? Authorizing Provider  aspirin EC 81 MG tablet Take 1 tablet (81 mg total) by mouth daily. 07/05/15  Yes Jonelle Sidle, MD  hyoscyamine (LEVBID) 0.375 MG 12 hr tablet Take 0.375 mg by mouth daily.   Yes [provider]  pravastatin (PRAVACHOL) 40 MG tablet Take 40 mg by mouth at bedtime. 06/21/15  Yes [provider]     Positive ROS: All other systems have been reviewed and were otherwise negative with the exception of those mentioned in the HPI and as above.  Physical Exam: General: Alert, no acute distress Cardiovascular: No pedal edema Respiratory: No cyanosis, no use of accessory musculature GI: No organomegaly, abdomen is soft and non-tender Skin: No lesions in the area of chief complaint Neurologic: Sensation intact distally Psychiatric: Patient is competent for consent with normal mood and affect Lymphatic: No axillary or cervical lymphadenopathy  MUSCULOSKELETAL: RLE: pain medially, wwp extremity, ligaments stable  Assessment: LOOSE FOREIGN BODY IN RIGHT KNEE, MENISCUS TEAR IN RIGHT KNEE  Plan: Plan for Procedure(s): RIGHT KNEE ARTHROSCOPY WITH MEDIAL MENISCECTOMY REMOVAL FOREIGN BODY  EXTREMITY RIGHT KNEE CHONDROPLASTY RIGHT KNEE  The risks benefits and alternatives were discussed with the patient including but not limited to the risks of nonoperative treatment, versus surgical intervention including infection, bleeding, nerve injury,  blood clots, cardiopulmonary complications, morbidity, mortality, among others, and they were willing to proceed.   Bjorn Pippinax T Renne Cornick, MD  09/13/2017 7:25 AM

## 2017-09-13 NOTE — Interval H&P Note (Signed)
Discussed case, risks and benefits with patient again.  All questions answered, no change to history.  Kyia Rhude MD  

## 2017-09-13 NOTE — Transfer of Care (Signed)
Immediate Anesthesia Transfer of Care Note  Patient: [name]  Procedure(s) Performed: RIGHT KNEE ARTHROSCOPY WITH PARTIAL MEDIAL MENISCECTOMY (Right Knee) CHONDROPLASTY RIGHT KNEE (Right Knee)  Patient Location: PACU  Anesthesia Type:General  Level of Consciousness: awake and sedated  Airway & Oxygen Therapy: Patient Spontanous Breathing and Patient connected to face mask oxygen  Post-op Assessment: Report given to RN and Post -op Vital signs reviewed and stable  Post vital signs: Reviewed and stable  Last Vitals:  Vitals:   09/13/17 1013  BP: (!) 146/86  Pulse: 94  Resp: 20  Temp: 36.8 C  SpO2: 100%    Last Pain:  Vitals:   09/13/17 1013  TempSrc: Oral  PainSc: 6          Complications: No apparent anesthesia complications

## 2017-09-13 NOTE — Discharge Instructions (Signed)

## 2017-09-13 NOTE — Op Note (Signed)
Orthopaedic Surgery Operative Note (CSN: [id number])  [name]  11/16/1955 Date of Surgery: 09/13/2017   Diagnoses:  Medial compartment moderate OA,  MENISCUS TEAR IN RIGHT KNEE medially  Procedures:   * RIGHT KNEE ARTHROSCOPY WITH PARTIAL MEDIAL MENISCECTOMY   * CHONDROPLASTY RIGHT KNEE 78295, [id number]   Operative Finding Successful completion of planned procedure.   Exam under anesthesia: Range of motion full and symmetric to opposite knee, ligamentously stable exam with normal lachman  Suprapatellar pouch:normal, patella with moderate medial wear, grade 2-3 now sp chondroplasty, area of small grade 2-3 in the center of the trochlea  Medial compartment: isolated Grade 4 changes, diffuse grade 1-2 changes.  Loose area of cartilage on femur with debridement sharply with shaver performed.  Medial meniscus with degenerative complex tear with horizontal component.  Primarily undersurface component removed and 40% total volume removed.    Lateral Compartment:  Grade 1 changes tibia and femur, meniscus intact  Intercondylar Notch: ligaments intact  Post-operative plan: The patient will be WBAT.  The patient will be dc home.  DVT prophylaxis with ASA 81mg  and early ambulation in this low risk surgery.  Pain control with PRN pain medication preferring oral medicines.  Follow up plan will be scheduled in approximately 10-14 days for wound check.  Post-Op Diagnosis: Same Surgeons:Primary: Bjorn Pippin, MD Assistants:None Location: [facility] OR ROOM 6 Anesthesia: General Antibiotics: Ancef 2g preop Tourniquet time: * Missing tourniquet times found for documented tourniquets in log: [id number] * Estimated Blood Loss: 10 Complications: None Specimens: None Implants: * No implants in log *  Indications for Surgery:   [name] is a 62 y.o. male with medial knee pain, mechanical symptoms refractory to non-operative measures.  We specifically had a long discussion about the fact that he has some  moderate OA and if his symptoms are primarily from that he would not get much relief potentially from his meniscectomy.   Benefits and risks of operative and nonoperative management were discussed prior to surgery with patient/guardian(s) and informed consent form was completed.  Specific risks including infection, need for additional surgery, continued pain and mechanical symptoms.   Procedure:   The patient was identified in the preoperative holding area where the surgical site was marked. The patient was taken to the OR where a procedural timeout was called and the above noted anesthesia was induced.  The patient was positioned supine on reg bed with c clamp.  Preoperative antibiotics were dosed.  The patient's right knee was prepped and draped in the usual sterile fashion.  A second preoperative timeout was called.       The patient was identified properly. Informed consent was obtained and the surgical site was marked. The patient was taken up to suite where general anesthesia was induced. The patient was placed in the supine position with a post against the surgical leg and a nonsterile tourniquet applied. The surgical leg was then prepped and draped usual sterile fashion.  A standard surgical timeout was performed.  2 standard anterior portals were made and diagnostic arthroscopy performed. Please note the findings as noted above.  We performed chondroplasty of the medial femoral condyle and medial facet patella as well as the central trochlea with a shaver.    Partial medial meniscectomy was performed with a biter and shaver in the setting of a horizontal complex tear that was irreparable.  We removed about 40 % total medial meniscal volume with some peripheral fibers still intact.  Incisions closed with absorbable  suture. The patient was awoken from general anesthesia and taken to the PACU in stable condition without complication.

## 2017-09-13 NOTE — Anesthesia Preprocedure Evaluation (Signed)
Anesthesia Evaluation  Patient identified by MRN, date of birth, ID band Patient awake    Reviewed: Allergy & Precautions, NPO status , Patient's Chart, lab work & pertinent test results  Airway Mallampati: II  TM Distance: >3 FB Neck ROM: Full    Dental no notable dental hx. (+) Upper Dentures   Pulmonary Current Smoker,    Pulmonary exam normal breath sounds clear to auscultation       Cardiovascular + CAD, + Past MI and + Cardiac Stents  Normal cardiovascular exam Rhythm:Regular Rate:Normal     Neuro/Psych negative neurological ROS  negative psych ROS   GI/Hepatic negative GI ROS, Neg liver ROS,   Endo/Other  negative endocrine ROS  Renal/GU negative Renal ROS  negative genitourinary   Musculoskeletal negative musculoskeletal ROS (+)   Abdominal   Peds negative pediatric ROS (+)  Hematology negative hematology ROS (+)   Anesthesia Other Findings   Reproductive/Obstetrics negative OB ROS                             Anesthesia Physical Anesthesia Plan  ASA: III  Anesthesia Plan: General   Post-op Pain Management:    Induction: Intravenous  PONV Risk Score and Plan: 1 and Ondansetron and Treatment may vary due to age or medical condition  Airway Management Planned: LMA  Additional Equipment:   Intra-op Plan:   Post-operative Plan:   Informed Consent: I have reviewed the patients History and Physical, chart, labs and discussed the procedure including the risks, benefits and alternatives for the proposed anesthesia with the patient or authorized representative who has indicated his/her understanding and acceptance.   Dental advisory given  Plan Discussed with: CRNA  Anesthesia Plan Comments:         Anesthesia Quick Evaluation

## 2017-09-14 ENCOUNTER — Encounter (HOSPITAL_BASED_OUTPATIENT_CLINIC_OR_DEPARTMENT_OTHER): Payer: Self-pay | Admitting: Orthopaedic Surgery

## 2017-09-21 DIAGNOSIS — M2341 Loose body in knee, right knee: Secondary | ICD-10-CM | POA: Diagnosis not present

## 2017-10-18 DIAGNOSIS — M2341 Loose body in knee, right knee: Secondary | ICD-10-CM | POA: Diagnosis not present

## 2019-02-21 ENCOUNTER — Ambulatory Visit: Payer: Self-pay | Admitting: Family Medicine

## 2019-04-16 ENCOUNTER — Other Ambulatory Visit: Payer: Self-pay

## 2019-04-17 ENCOUNTER — Ambulatory Visit (INDEPENDENT_AMBULATORY_CARE_PROVIDER_SITE_OTHER): Payer: BC Managed Care – PPO | Admitting: Family Medicine

## 2019-04-17 ENCOUNTER — Encounter: Payer: Self-pay | Admitting: Family Medicine

## 2019-04-17 VITALS — BP 132/77 | HR 87 | Temp 97.8°F | Ht 69.0 in | Wt 149.0 lb

## 2019-04-17 DIAGNOSIS — E639 Nutritional deficiency, unspecified: Secondary | ICD-10-CM

## 2019-04-17 DIAGNOSIS — I25119 Atherosclerotic heart disease of native coronary artery with unspecified angina pectoris: Secondary | ICD-10-CM

## 2019-04-17 DIAGNOSIS — E785 Hyperlipidemia, unspecified: Secondary | ICD-10-CM

## 2019-04-17 DIAGNOSIS — Z23 Encounter for immunization: Secondary | ICD-10-CM

## 2019-04-17 DIAGNOSIS — Z1159 Encounter for screening for other viral diseases: Secondary | ICD-10-CM | POA: Diagnosis not present

## 2019-04-17 DIAGNOSIS — K580 Irritable bowel syndrome with diarrhea: Secondary | ICD-10-CM | POA: Diagnosis not present

## 2019-04-17 DIAGNOSIS — Z130 Encounter for screening for diseases of the blood and blood-forming organs and certain disorders involving the immune mechanism: Secondary | ICD-10-CM | POA: Diagnosis not present

## 2019-04-17 DIAGNOSIS — N492 Inflammatory disorders of scrotum: Secondary | ICD-10-CM

## 2019-04-17 MED ORDER — HYOSCYAMINE SULFATE ER 0.375 MG PO TB12: 0.3750 mg | ORAL_TABLET | Freq: Every day | ORAL | 1 refills | Status: DC

## 2019-04-17 MED ORDER — PRAVASTATIN SODIUM 40 MG PO TABS: 40.0000 mg | ORAL_TABLET | Freq: Every evening | ORAL | 1 refills | Status: DC

## 2019-04-17 MED ORDER — SULFAMETHOXAZOLE-TRIMETHOPRIM 800-160 MG PO TABS: 1.0000 | ORAL_TABLET | Freq: Two times a day (BID) | ORAL | 0 refills | Status: AC

## 2019-04-17 NOTE — Progress Notes (Signed)
New Patient Office Visit  Assessment & Plan:  1. Irritable bowel syndrome with diarrhea - Well controlled on current regimen.  - hyoscyamine (LEVBID) 0.375 MG 12 hr tablet; Take 1 tablet (0.375 mg total) by mouth daily.  Dispense: 90 tablet; Refill: 1  2. Dyslipidemia - Well controlled on current regimen.  - pravastatin (PRAVACHOL) 40 MG tablet; Take 1 tablet (40 mg total) by mouth every evening.  Dispense: 90 tablet; Refill: 1 - CMP14+EGFR - Lipid panel  3. Coronary artery disease involving native coronary artery of native heart with angina pectoris [facility]) - Patient denies any chest pain. He does take ASA 81 mg daily as well as Pravastatin 40 mg every evening.   4. Scrotal abscess - Education provided on abscesses. Encouraged application of heat or warm showers several times a day to help with drainage.  - sulfamethoxazole-trimethoprim (BACTRIM DS) 800-160 MG tablet; Take 1 tablet by mouth 2 (two) times daily for 10 days.  Dispense: 20 tablet; Refill: 0  5. Poor diet - Patient not eating well since he no longer has his wife cooking for him. I will look into food options for him and get back with him.   6. Screening for deficiency anemia - CBC with Differential/Platelet  7. Encounter for hepatitis C screening test for low risk patient - Hepatitis C antibody  8. Need for immunization against influenza - Flu Vaccine QUAD 36+ mos IM   Follow-up: Return in about 6 months (around 10/15/2019) for annual physical.   Hendricks Limes, MSN, APRN, FNP-C [facility]  Subjective:  Patient ID: [name], male    DOB: 28-Nov-1955  Age: 63 y.o. MRN: [id number]  Patient Care Team: Loman Brooklyn, FNP as PCP - General (Family Medicine)  CC:  Chief Complaint  Patient presents with  . New Patient (Initial Visit)    Dr. Edrick Oh     HPI [name] presents to establish care. He is transferring care from Dr. Murrell Redden office as he has retired and the office has  closed. Patient also c/o a knot in his groin area.   Abscess: Patient presents for evaluation of a cutaneous abscess. Lesion is located on the right scrotum. Onset was 1 day ago. Symptoms have been unchanged. Abscess has associated symptoms of pain. He rates the pain 6/10. He reports his underwear and walking irritate the area more. No home treatment. Patient does have previous history of cutaneous abscesses once when he was a child. Patient does not have diabetes.  Patient does admit that he has not been eating very well since his wife passed away four weeks ago. He states when he was growing up, his mom cooked all the time and then he got married and his wife cooked. He has never had to cook for himself and admits he is not very good at it. He does sometimes eat out but does not wish to eat out all the time. He leaves his house around 3 AM and does not return until 3-4 PM after work.    Review of Systems  Constitutional: Negative for chills, fever, malaise/fatigue and weight loss.  HENT: Negative for congestion, ear discharge, ear pain, nosebleeds, sinus pain, sore throat and tinnitus.   Eyes: Negative for blurred vision, double vision, pain, discharge and redness.  Respiratory: Negative for cough, shortness of breath and wheezing.   Cardiovascular: Negative for chest pain, palpitations and leg swelling.  Gastrointestinal: Positive for diarrhea. Negative for abdominal pain, constipation, heartburn, nausea and  vomiting.  Genitourinary: Negative for dysuria, frequency and urgency.  Musculoskeletal: Negative for myalgias.  Skin: Negative for rash.  Neurological: Negative for dizziness, seizures, weakness and headaches.  Psychiatric/Behavioral: Negative for depression, substance abuse and suicidal ideas. The patient is not nervous/anxious.     Current Outpatient Medications:  .  aspirin EC 81 MG tablet, Take 1 tablet (81 mg total) by mouth daily., Disp: 90 tablet, Rfl: 3 .  hyoscyamine (LEVBID)  0.375 MG 12 hr tablet, Take 1 tablet (0.375 mg total) by mouth daily., Disp: 90 tablet, Rfl: 1 .  pravastatin (PRAVACHOL) 40 MG tablet, Take 1 tablet (40 mg total) by mouth every evening., Disp: 90 tablet, Rfl: 1 .  sulfamethoxazole-trimethoprim (BACTRIM DS) 800-160 MG tablet, Take 1 tablet by mouth 2 (two) times daily for 10 days., Disp: 20 tablet, Rfl: 0  No Known Allergies  Past Medical History:  Diagnosis Date  . CAD (coronary artery disease)    BMS to RCA May 2009, cutting balloon angioplasty for restenosis July 2009, then DES to RCA  August 2009.  Marland Kitchen History of inferior wall myocardial infarction 11/2007  . Hyperlipidemia   . IBS (irritable bowel syndrome)   . Loose body of right knee 08/2017  . Medial meniscus tear 08/2017   right knee  . MYOCARDIAL INFARCTION, INFERIOR WALL 11/27/2008   Qualifier: Diagnosis of  By: [place], [place]    . Wears dentures    upper    Past Surgical History:  Procedure Laterality Date  . APPENDECTOMY  07/12/2002  . CARDIAC CATHETERIZATION  11/22/2007   stent RCA  . CARDIAC CATHETERIZATION  02/21/2008   restenosis RCA - angioplasty  . CARDIAC CATHETERIZATION  02/28/2008   drug-eluting stent RCA  . CHONDROPLASTY Right 09/13/2017   Procedure: CHONDROPLASTY RIGHT KNEE;  Surgeon: Hiram Gash, MD;  Location: [facility];  Service: Orthopedics;  Laterality: Right;  . FOOT SURGERY Right   . KNEE ARTHROSCOPY Right   . KNEE ARTHROSCOPY WITH MEDIAL MENISECTOMY Right 09/13/2017   Procedure: RIGHT KNEE ARTHROSCOPY WITH PARTIAL MEDIAL MENISCECTOMY;  Surgeon: Hiram Gash, MD;  Location: [facility];  Service: Orthopedics;  Laterality: Right;    Family History  Problem Relation Age of Onset  . Heart attack Father        h/o CABG and died of massive heart attackat age 62  . Thyroid disease Sister     Social History   Socioeconomic History  . Marital status: Widowed    Spouse name: Not on file  . Number of children:  Not on file  . Years of education: Not on file  . Highest education level: Not on file  Occupational History  . Not on file  Social Needs  . Financial resource strain: Not on file  . Food insecurity    Worry: Not on file    Inability: Not on file  . Transportation needs    Medical: Not on file    Non-medical: Not on file  Tobacco Use  . Smoking status: Current Every Day Smoker    Years: 30.00    Types: Cigarettes  . Smokeless tobacco: Never Used  . Tobacco comment: 1 pack/3 days  Substance and Sexual Activity  . Alcohol use: No  . Drug use: No  . Sexual activity: Not Currently  Lifestyle  . Physical activity    Days per week: Not on file    Minutes per session: Not on file  . Stress: Not on file  Relationships  . Social Herbalist on phone: Not on file    Gets together: Not on file    Attends religious service: Not on file    Active member of club or organization: Not on file    Attends meetings of clubs or organizations: Not on file    Relationship status: Not on file  . Intimate partner violence    Fear of current or ex partner: Not on file    Emotionally abused: Not on file    Physically abused: Not on file    Forced sexual activity: Not on file  Other Topics Concern  . Not on file  Social History Narrative  . Not on file    Objective:   Today's Vitals: BP 132/77   Pulse 87   Temp 97.8 F (36.6 C) (Temporal)   Ht '5\' 9"'$  (1.753 m)   Wt 149 lb (67.6 kg)   SpO2 97%   BMI 22.00 kg/m   Physical Exam Vitals signs reviewed.  Constitutional:      General: He is not in acute distress.    Appearance: Normal appearance. He is normal weight. He is not ill-appearing, toxic-appearing or diaphoretic.  HENT:     Head: Normocephalic and atraumatic.  Eyes:     General: No scleral icterus.       Right eye: No discharge.        Left eye: No discharge.     Conjunctiva/sclera: Conjunctivae normal.  Neck:     Musculoskeletal: Normal range of motion.   Cardiovascular:     Rate and Rhythm: Normal rate and regular rhythm.     Heart sounds: Normal heart sounds. No murmur. No friction rub. No gallop.   Pulmonary:     Effort: Pulmonary effort is normal. No respiratory distress.     Breath sounds: Normal breath sounds. No stridor. No wheezing, rhonchi or rales.  Musculoskeletal: Normal range of motion.  Skin:    General: Skin is warm and dry.     Findings: Abscess (area 2x1 cm of induration - when patient went to a lying position on the table drainage came from abscess.) present.     Comments: Pressure applied to expel drainage from abscess. Purulent drainage had a foul odor.   Neurological:     Mental Status: He is alert and oriented to person, place, and time. Mental status is at baseline.  Psychiatric:        Mood and Affect: Mood normal.        Behavior: Behavior normal.        Thought Content: Thought content normal.        Judgment: Judgment normal.

## 2019-04-17 NOTE — Patient Instructions (Signed)

## 2019-04-18 ENCOUNTER — Encounter: Payer: Self-pay | Admitting: Family Medicine

## 2019-04-18 LAB — CMP14+EGFR
ALT: 9 IU/L (ref 0–44)
AST: 14 IU/L (ref 0–40)
Albumin/Globulin Ratio: 2 (ref 1.2–2.2)
Albumin: 4.3 g/dL (ref 3.8–4.8)
Alkaline Phosphatase: 125 IU/L — ABNORMAL HIGH (ref 39–117)
BUN/Creatinine Ratio: 9 — ABNORMAL LOW (ref 10–24)
BUN: 8 mg/dL (ref 8–27)
Bilirubin Total: 0.3 mg/dL (ref 0.0–1.2)
CO2: 23 mmol/L (ref 20–29)
Calcium: 9.4 mg/dL (ref 8.6–10.2)
Chloride: 102 mmol/L (ref 96–106)
Creatinine, Ser: 0.85 mg/dL (ref 0.76–1.27)
GFR calc Af Amer: 108 mL/min/{1.73_m2} (ref >59)
GFR calc non Af Amer: 93 mL/min/{1.73_m2} (ref >59)
Globulin, Total: 2.2 g/dL (ref 1.5–4.5)
Glucose: 96 mg/dL (ref 65–99)
Potassium: 3.6 mmol/L (ref 3.5–5.2)
Sodium: 140 mmol/L (ref 134–144)
Total Protein: 6.5 g/dL (ref 6.0–8.5)

## 2019-04-18 LAB — CBC WITH DIFFERENTIAL/PLATELET
Basophils Absolute: 0.1 10*3/uL (ref 0.0–0.2)
Basos: 0 %
EOS (ABSOLUTE): 0.2 10*3/uL (ref 0.0–0.4)
Eos: 2 %
Hematocrit: 42.7 % (ref 37.5–51.0)
Hemoglobin: 13.9 g/dL (ref 13.0–17.7)
Immature Grans (Abs): 0 10*3/uL (ref 0.0–0.1)
Immature Granulocytes: 0 %
Lymphocytes Absolute: 3.5 10*3/uL — ABNORMAL HIGH (ref 0.7–3.1)
Lymphs: 28 %
MCH: 29.4 pg (ref 26.6–33.0)
MCHC: 32.6 g/dL (ref 31.5–35.7)
MCV: 91 fL (ref 79–97)
Monocytes Absolute: 0.8 10*3/uL (ref 0.1–0.9)
Monocytes: 7 %
Neutrophils Absolute: 7.9 10*3/uL — ABNORMAL HIGH (ref 1.4–7.0)
Neutrophils: 63 %
Platelets: 273 10*3/uL (ref 150–450)
RBC: 4.72 x10E6/uL (ref 4.14–5.80)
RDW: 13.4 % (ref 11.6–15.4)
WBC: 12.6 10*3/uL — ABNORMAL HIGH (ref 3.4–10.8)

## 2019-04-18 LAB — HEPATITIS C ANTIBODY: Hep C Virus Ab: 0.2 s/co ratio (ref 0.0–0.9)

## 2019-04-18 LAB — LIPID PANEL
Chol/HDL Ratio: 3.5 ratio (ref 0.0–5.0)
Cholesterol, Total: 156 mg/dL (ref 100–199)
HDL: 44 mg/dL (ref >39)
LDL Chol Calc (NIH): 97 mg/dL (ref 0–99)
Triglycerides: 75 mg/dL (ref 0–149)
VLDL Cholesterol Cal: 15 mg/dL (ref 5–40)

## 2019-04-21 ENCOUNTER — Encounter: Payer: Self-pay | Admitting: *Deleted

## 2019-04-23 ENCOUNTER — Encounter: Payer: Self-pay | Admitting: Family Medicine

## 2019-05-15 ENCOUNTER — Encounter: Payer: Self-pay | Admitting: Family Medicine

## 2019-06-17 ENCOUNTER — Telehealth: Payer: Self-pay | Admitting: Family Medicine

## 2019-06-17 NOTE — Telephone Encounter (Signed)
Please call [facility] and see if they have documentation of a colonoscopy.  I have requested this record twice with no response.

## 2019-06-17 NOTE — Telephone Encounter (Signed)
Spoke with [facility] - sending over report.

## 2019-06-24 NOTE — Telephone Encounter (Signed)
Received. Placed in out basket to be scanned to chart.

## 2019-07-24 ENCOUNTER — Ambulatory Visit (INDEPENDENT_AMBULATORY_CARE_PROVIDER_SITE_OTHER): Payer: BC Managed Care – PPO | Admitting: Family Medicine

## 2019-07-24 ENCOUNTER — Other Ambulatory Visit: Payer: Self-pay

## 2019-07-24 ENCOUNTER — Encounter: Payer: Self-pay | Admitting: Family Medicine

## 2019-07-24 VITALS — BP 135/73 | HR 70 | Ht 69.0 in | Wt 150.0 lb

## 2019-07-24 DIAGNOSIS — I25119 Atherosclerotic heart disease of native coronary artery with unspecified angina pectoris: Secondary | ICD-10-CM | POA: Diagnosis not present

## 2019-07-24 DIAGNOSIS — E785 Hyperlipidemia, unspecified: Secondary | ICD-10-CM

## 2019-07-24 NOTE — Patient Instructions (Signed)

## 2019-07-24 NOTE — Progress Notes (Signed)
Cardiology Office Note  Date: 07/24/2019   ID: [name] Lykens, DOB 09/11/1955, MRN [id number]  PCP:  Gwenlyn FudgeJoyce, Britney F, FNP  Cardiologist:  Nona DellSamuel McDowell, MD Electrophysiologist:  None   No chief complaint on file.   History of Present Illness: [name] Munday is a 63 y.o. male last encounter with Dr. Diona BrownerMcDowell on August 23, 2017 for preop clearance exam for right arthroscopic knee surgery.  History of coronary artery disease with bare-metal stent to RCA in 2009.  Cutting Balloon angioplasty for restenosis 2009.  DES to RCA 2009 August.  History of hypertension, inferior wall MI, hyperlipidemia, IBS.  Patient had previous cardiac testing 2016.  Last left ventricular ejection fraction was approximately 45% with perfusion imaging consistent with inferior/inferior lateral scar and mild peri-infarct ischemia.  Current medications are reviewed today and include; aspirin 81 mg, pravastatin 40 mg daily.   EKG reviewed today.  Sinus rhythm 75 old inferior apical infarct nonspecific ST depression.  He denies any progressive anginal or exertional symptoms.  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  He continues to smoke.   Past Medical History:  Diagnosis Date  . CAD (coronary artery disease)    BMS to RCA May 2009, cutting balloon angioplasty for restenosis July 2009, then DES to RCA  August 2009.  Marland Kitchen. History of inferior wall myocardial infarction 11/2007  . Hyperlipidemia   . IBS (irritable bowel syndrome)   . Loose body of right knee 08/2017  . Medial meniscus tear 08/2017   right knee  . MYOCARDIAL INFARCTION, INFERIOR WALL 11/27/2008   Qualifier: Diagnosis of  By: Genelle GatherShoffner CMA, [place]    . Wears dentures    upper    Past Surgical History:  Procedure Laterality Date  . APPENDECTOMY  07/12/2002  . CARDIAC CATHETERIZATION  11/22/2007   stent RCA  . CARDIAC CATHETERIZATION  02/21/2008   restenosis RCA - angioplasty    . CARDIAC CATHETERIZATION  02/28/2008   drug-eluting stent RCA  . CHONDROPLASTY Right 09/13/2017   Procedure: CHONDROPLASTY RIGHT KNEE;  Surgeon: Bjorn PippinVarkey, Dax T, MD;  Location: [facility];  Service: Orthopedics;  Laterality: Right;  . FOOT SURGERY Right   . KNEE ARTHROSCOPY Right   . KNEE ARTHROSCOPY WITH MEDIAL MENISECTOMY Right 09/13/2017   Procedure: RIGHT KNEE ARTHROSCOPY WITH PARTIAL MEDIAL MENISCECTOMY;  Surgeon: Bjorn PippinVarkey, Dax T, MD;  Location:  SUR[facility]ERY [facility];  Service: Orthopedics;  Laterality: Right;    Current Outpatient Medications  Medication Sig Dispense Refill  . aspirin EC 81 MG tablet Take 1 tablet (81 mg total) by mouth daily. 90 tablet 3  . hyoscyamine (LEVBID) 0.375 MG 12 hr tablet Take 1 tablet (0.375 mg total) by mouth daily. 90 tablet 1  . pravastatin (PRAVACHOL) 40 MG tablet Take 1 tablet (40 mg total) by mouth every evening. 90 tablet 1   No current facility-administered medications for this visit.   Allergies:  Patient has no known allergies.   Social History: The patient  reports that he has been smoking cigarettes. He has smoked for the past 30.00 years. He has never used smokeless tobacco. He reports that he does not drink alcohol or use drugs.   Family History: The patient's family history includes Heart attack in his father; Thyroid disease in his sister.   ROS:  Please see the history of present illness. Otherwise, complete review of systems is positive for none.  All other systems are reviewed and negative.   Physical Exam: VS:  BP 135/73   Pulse 70   Ht 5\' 9"  (1.753 m)   Wt 150 lb (68 kg)   SpO2 100%   BMI 22.15 kg/m , BMI Body mass index is 22.15 kg/m.  Wt Readings from Last 3 Encounters:  07/24/19 150 lb (68 kg)  04/17/19 149 lb (67.6 kg)  09/13/17 153 lb (69.4 kg)    General: Patient appears comfortable at rest. HEENT: Conjunctiva and lids normal, oropharynx clear with moist mucosa. Neck: Supple, no elevated JVP or  carotid bruits, no thyromegaly. Lungs: Clear to auscultation, nonlabored breathing at rest. Cardiac: Regular rate and rhythm, no S3 or significant systolic murmur, no pericardial rub. Abdomen: Soft, nontender, no hepatomegaly, bowel sounds present, no guarding or rebound. Extremities: No pitting edema, distal pulses 2+. Skin: Warm and dry. Musculoskeletal: No kyphosis. Neuropsychiatric: Alert and oriented x3, affect grossly appropriate.  ECG:  An ECG dated July 24, 2019 was personally reviewed today and demonstrated:  Sinus rhythm 32 old inferior/apical infarct, nonspecific ST depression.  Recent Labwork: 04/17/2019: ALT 9; AST 14; BUN 8; Creatinine, Ser 0.85; Hemoglobin 13.9; Platelets 273; Potassium 3.6; Sodium 140     Component Value Date/Time   CHOL 156 04/17/2019 1554   TRIG 75 04/17/2019 1554   HDL 44 04/17/2019 1554   CHOLHDL 3.5 04/17/2019 1554   CHOLHDL 5.3 11/23/2007 0350   VLDL 17 11/23/2007 0350   LDLCALC 97 04/17/2019 1554    Other Studies Reviewed Today:  Exercise Myoview 07/12/2015:  Abnormal ST segment depression noted in leads V5 and V6 toward peak heart rate in the absence of chest pain. No significant arrhythmias.  Large perfusion defect in the inferior and inferolateral wall consistent with scar from previous infarct. There is partial reversibility in the lateral portion of this defect consistent with mild peri-infarct ischemia.  This is an intermediate risk study.  Nuclear stress EF: 41%.  Echocardiogram 07/12/2015: Study Conclusions  - Left ventricle: The cavity size was normal. Wall thickness was normal. Systolic function was mildly to moderately reduced. The estimated ejection fraction was 45%. There is akinesis of the inferolateral and inferior myocardium. Doppler parameters are consistent with abnormal left ventricular relaxation (grade 1 diastolic dysfunction). - Mitral valve: Mildly thickened leaflets . There was  trivial regurgitation. - Right atrium: Central venous pressure (est): 3 mm Hg. - Tricuspid valve: There was trivial regurgitation. - Pulmonary arteries: Systolic pressure could not be accurately estimated. - Pericardium, extracardiac: There was no pericardial effusion.  Impressions:  - Normal LV wall thickness with LVEF approximately 45%. There is inferior and inferolateral akinesis consistent with ischemic cardiomyopathy. Grade 1 diastolic dysfunction. Mildly thickened mitral leaflets with trivial mitral regurgitation.  Assessment and Plan:  1. Coronary artery disease involving native coronary artery of native heart with angina pectoris (HCC)   2. Dyslipidemia    1. Coronary artery disease involving native coronary artery of native heart with angina pectoris [facility]) Previous history of MI and stent placement RCA in 2009, cutting balloon angioplasty for restenosis in 2009.  DES to RCA 2009.  Denies any recent progressive anginal or exertional symptoms, palpitations or arrhythmias, orthostatic symptoms.  Continue aspirin 81 mg daily.   2. Dyslipidemia History of hyperlipidemia on pravastatin 40 mg p.o. daily.  Last LDL 97.  Patient is due to have an annual wellness visit with primary care provider next month.  Advised the patient to have PCP draw lipid panel and possibly adjust or  add lipid medication.  Medication Adjustments/Labs and Tests Ordered: Current medicines are reviewed at length with the patient today.  Concerns regarding medicines are outlined above.    There are no Patient Instructions on file for this visit.       Signed, Levell July, NP 07/24/2019 9:04 AM    [facility], [place], [place] [address] Phone: [phone]; Fax: [phone]

## 2019-09-01 ENCOUNTER — Telehealth: Payer: Self-pay | Admitting: Family Medicine

## 2019-09-01 NOTE — Telephone Encounter (Signed)
I do not know what insurance he will have or what will be covered by it. We can try alternatives when needed to find out what his new insurance will cover. It looks like he has an appointment with me in March.

## 2019-09-01 NOTE — Telephone Encounter (Signed)
Pt called back. Returning missed call from Jan to let her know that he plans on retiring by April and wants advice on what medicines he can take for IBS that are OTC. Pt says he wants to be called back tomorrow around 3pm.

## 2019-09-01 NOTE — Telephone Encounter (Signed)
Left message to call back  

## 2019-09-03 NOTE — Telephone Encounter (Signed)
Patient would like to know if there are any OTC medications he can take for IBS.

## 2019-09-03 NOTE — Telephone Encounter (Signed)
Patient aware and verbalizes understanding. 

## 2019-09-03 NOTE — Telephone Encounter (Signed)
If he wants he can try things like IBGard, Colon Health, Digestive Advantage, or a probiotic. Can also treat symptoms depending on what they are with Pepto-Bismol, Imodium, Gas-X, etc.

## 2019-10-11 ENCOUNTER — Other Ambulatory Visit: Payer: Self-pay | Admitting: Family Medicine

## 2019-10-11 DIAGNOSIS — E785 Hyperlipidemia, unspecified: Secondary | ICD-10-CM

## 2019-10-16 NOTE — Patient Instructions (Signed)
Preventive Care 41-63 Years Old, Male Preventive care refers to lifestyle choices and visits with your health care provider that can promote health and wellness. This includes:  A yearly physical exam. This is also called an annual well check.  Regular dental and eye exams.  Immunizations.  Screening for certain conditions.  Healthy lifestyle choices, such as eating a healthy diet, getting regular exercise, not using drugs or products that contain nicotine and tobacco, and limiting alcohol use. What can I expect for my preventive care visit? Physical exam Your health care provider will check:  Height and weight. These may be used to calculate body mass index (BMI), which is a measurement that tells if you are at a healthy weight.  Heart rate and blood pressure.  Your skin for abnormal spots. Counseling Your health care provider may ask you questions about:  Alcohol, tobacco, and drug use.  Emotional well-being.  Home and relationship well-being.  Sexual activity.  Eating habits.  Work and work Statistician. What immunizations do I need?  Influenza (flu) vaccine  This is recommended every year. Tetanus, diphtheria, and pertussis (Tdap) vaccine  You may need a Td booster every 10 years. Varicella (chickenpox) vaccine  You may need this vaccine if you have not already been vaccinated. Zoster (shingles) vaccine  You may need this after age 63. Measles, mumps, and rubella (MMR) vaccine  You may need at least one dose of MMR if you were born in 1957 or later. You may also need a second dose. Pneumococcal conjugate (PCV13) vaccine  You may need this if you have certain conditions and were not previously vaccinated. Pneumococcal polysaccharide (PPSV23) vaccine  You may need one or two doses if you smoke cigarettes or if you have certain conditions. Meningococcal conjugate (MenACWY) vaccine  You may need this if you have certain conditions. Hepatitis A  vaccine  You may need this if you have certain conditions or if you travel or work in places where you may be exposed to hepatitis A. Hepatitis B vaccine  You may need this if you have certain conditions or if you travel or work in places where you may be exposed to hepatitis B. Haemophilus influenzae type b (Hib) vaccine  You may need this if you have certain risk factors. Human papillomavirus (HPV) vaccine  If recommended by your health care provider, you may need three doses over 6 months. You may receive vaccines as individual doses or as more than one vaccine together in one shot (combination vaccines). Talk with your health care provider about the risks and benefits of combination vaccines. What tests do I need? Blood tests  Lipid and cholesterol levels. These may be checked every 5 years, or more frequently if you are over 60 years old.  Hepatitis C test.  Hepatitis B test. Screening  Lung cancer screening. You may have this screening every year starting at age 63 if you have a 30-pack-year history of smoking and currently smoke or have quit within the past 15 years.  Prostate cancer screening. Recommendations will vary depending on your family history and other risks.  Colorectal cancer screening. All adults should have this screening starting at age 63 and continuing until age 2. Your health care provider may recommend screening at age 63 if you are at increased risk. You will have tests every 1-10 years, depending on your results and the type of screening test.  Diabetes screening. This is done by checking your blood sugar (glucose) after you have not eaten  for a while (fasting). You may have this done every 1-3 years.  Sexually transmitted disease (STD) testing. Follow these instructions at home: Eating and drinking  Eat a diet that includes fresh fruits and vegetables, whole grains, lean protein, and low-fat dairy products.  Take vitamin and mineral supplements as  recommended by your health care provider.  Do not drink alcohol if your health care provider tells you not to drink.  If you drink alcohol: ? Limit how much you have to 0-2 drinks a day. ? Be aware of how much alcohol is in your drink. In the [place] one drink equals one 12 oz bottle of beer (355 mL), one 5 oz glass of wine (148 mL), or one 1 oz glass of hard liquor (44 mL). Lifestyle  Take daily care of your teeth and gums.  Stay active. Exercise for at least 30 minutes on 5 or more days each week.  Do not use any products that contain nicotine or tobacco, such as cigarettes, e-cigarettes, and chewing tobacco. If you need help quitting, ask your health care provider.  If you are sexually active, practice safe sex. Use a condom or other form of protection to prevent STIs (sexually transmitted infections).  Talk with your health care provider about taking a low-dose aspirin every day starting at age 63. What's next?  Go to your health care provider once a year for a well check visit.  Ask your health care provider how often you should have your eyes and teeth checked.  Stay up to date on all vaccines. This information is not intended to replace advice given to you by your health care provider. Make sure you discuss any questions you have with your health care provider. Document Revised: 07/04/2018 Document Reviewed: 07/04/2018 Elsevier Patient Education  2020 Elsevier Inc.  

## 2019-10-16 NOTE — Progress Notes (Signed)
Assessment & Plan:  1. Well adult exam - Preventive health education provided. Patient is UTD with hepatitis C screening, influenza, colonoscopy, and TDAP. He declined HIV screening and lung cancer screening. Shingrix is at the pharmacy for administration. He will get the COVID-19 vaccines after he retires.  - CBC with Differential/Platelet - CMP14+EGFR - Lipid panel  2. Irritable bowel syndrome with diarrhea - Well controlled on current regimen.  - CMP14+EGFR - hyoscyamine (LEVBID) 0.375 MG 12 hr tablet; Take 1 tablet (0.375 mg total) by mouth daily.  Dispense: 90 tablet; Refill: 3  3. Coronary artery disease involving native coronary artery of native heart with angina pectoris ([place]) - Well controlled on current regimen.  - CMP14+EGFR - Lipid panel  4. Dyslipidemia - Well controlled on current regimen.  - CMP14+EGFR - Lipid panel - pravastatin (PRAVACHOL) 40 MG tablet; Take 1 tablet (40 mg total) by mouth every evening.  Dispense: 90 tablet; Refill: 3  5. Skin lesion of face - Encouraged referral to dermatology but patient declined. He will notify me if there are any changes to the area.   6. Prostate cancer screening - PSA, total and free  7. Immunization due - SHINGRIX injection; Inject 0.5 mLs into the muscle once for 1 dose.  Dispense: 0.5 mL; Refill: 0   Follow-up: Return in about 1 year (around 10/16/2020) for annual physical.   Hendricks Limes, MSN, APRN, FNP-C [facility]  Subjective:  Patient ID: [name], male    DOB: Jul 31, 1955  Age: 65 y.o. MRN: [id number]  Patient Care Team: Loman Brooklyn, FNP as PCP - General (Family Medicine) Satira Sark, MD as PCP - Cardiology (Cardiology)   CC:  Chief Complaint  Patient presents with  . Annual Exam    HPI [name] presents for his annual physical.   Occupation: retiring on April 1st, Marital status: widowed, Substance use: none Diet: regular, Exercise: staying busy Last  eye exam: February 2021 Last dental exam: "a while" Last colonoscopy: 01/16/2014 Lung Cancer Screening with low-dose Chest CT: declined Hepatitis C Screening: completed 04/17/2019 PSA: 0.6 on 04/20/2017 Immunizations: Flu Vaccine: up to date Tdap Vaccine: up to date  Shingrix Vaccine: at the pharmacy for administration, so it can be run through insurance first   COVID-19 Vaccine: will get after he retires on 10/23/2019  DEPRESSION SCREENING PHQ 2/9 Scores 10/17/2019 04/17/2019  PHQ - 2 Score 0 2  PHQ- 9 Score - 4     Review of Systems  Constitutional: Negative for chills, fever, malaise/fatigue and weight loss.  HENT: Negative for congestion, ear discharge, ear pain, nosebleeds, sinus pain, sore throat and tinnitus.   Eyes: Negative for blurred vision, double vision, pain, discharge and redness.  Respiratory: Negative for cough, shortness of breath and wheezing.   Cardiovascular: Negative for chest pain, palpitations and leg swelling.  Gastrointestinal: Negative for abdominal pain, constipation, diarrhea, heartburn, nausea and vomiting.  Genitourinary: Negative for dysuria, frequency and urgency.       Denies trouble initiating a urine stream, weak stream, split stream, and dribbling.   Musculoskeletal: Negative for myalgias.  Skin: Negative for rash.  Neurological: Negative for dizziness, seizures, weakness and headaches.  Psychiatric/Behavioral: Negative for depression, substance abuse and suicidal ideas. The patient is not nervous/anxious.     Current Outpatient Medications:  .  aspirin EC 81 MG tablet, Take 1 tablet (81 mg total) by mouth daily., Disp: 90 tablet, Rfl: 3 .  hyoscyamine (LEVBID) 0.375 MG 12  hr tablet, Take 1 tablet (0.375 mg total) by mouth daily., Disp: 90 tablet, Rfl: 3 .  pravastatin (PRAVACHOL) 40 MG tablet, Take 1 tablet (40 mg total) by mouth every evening., Disp: 90 tablet, Rfl: 3 .  SHINGRIX injection, Inject 0.5 mLs into the muscle once for 1 dose., Disp: 0.5  mL, Rfl: 0  No Known Allergies  Past Medical History:  Diagnosis Date  . CAD (coronary artery disease)    BMS to RCA May 2009, cutting balloon angioplasty for restenosis July 2009, then DES to RCA  August 2009.  Marland Kitchen History of inferior wall myocardial infarction 11/2007  . Hyperlipidemia   . IBS (irritable bowel syndrome)   . Loose body of right knee 08/2017  . Medial meniscus tear 08/2017   right knee  . MYOCARDIAL INFARCTION, INFERIOR WALL 11/27/2008   Qualifier: Diagnosis of  By: [place], [place]    . Wears dentures    upper    Past Surgical History:  Procedure Laterality Date  . APPENDECTOMY  07/12/2002  . CARDIAC CATHETERIZATION  11/22/2007   stent RCA  . CARDIAC CATHETERIZATION  02/21/2008   restenosis RCA - angioplasty  . CARDIAC CATHETERIZATION  02/28/2008   drug-eluting stent RCA  . CHONDROPLASTY Right 09/13/2017   Procedure: CHONDROPLASTY RIGHT KNEE;  Surgeon: Hiram Gash, MD;  Location: [place];  Service: Orthopedics;  Laterality: Right;  . FOOT SURGERY Right   . KNEE ARTHROSCOPY Right   . KNEE ARTHROSCOPY WITH MEDIAL MENISECTOMY Right 09/13/2017   Procedure: RIGHT KNEE ARTHROSCOPY WITH PARTIAL MEDIAL MENISCECTOMY;  Surgeon: Hiram Gash, MD;  Location: [place];  Service: Orthopedics;  Laterality: Right;    Family History  Problem Relation Age of Onset  . Heart attack Father        h/o CABG and died of massive heart attackat age 14  . Thyroid disease Sister     Social History   Socioeconomic History  . Marital status: Widowed    Spouse name: Not on file  . Number of children: Not on file  . Years of education: Not on file  . Highest education level: Not on file  Occupational History  . Occupation: Works at a plant  . Occupation: [employer], occupational  . Occupation: Builds bird houses  Tobacco Use  . Smoking status: Current Every Day Smoker    Years: 30.00    Types: Cigarettes  . Smokeless tobacco: Never Used   . Tobacco comment: 1 pack/3 days  Substance and Sexual Activity  . Alcohol use: No  . Drug use: No  . Sexual activity: Not Currently  Other Topics Concern  . Not on file  Social History Narrative  . Not on file   Social Determinants of Health   Financial Resource Strain:   . Difficulty of Paying Living Expenses:   Food Insecurity:   . Worried About Charity fundraiser in the Last Year:   . Arboriculturist in the Last Year:   Transportation Needs:   . Film/video editor (Medical):   Marland Kitchen Lack of Transportation (Non-Medical):   Physical Activity:   . Days of Exercise per Week:   . Minutes of Exercise per Session:   Stress:   . Feeling of Stress :   Social Connections:   . Frequency of Communication with Friends and Family:   . Frequency of Social Gatherings with Friends and Family:   . Attends Religious Services:   . Active  Member of Clubs or Organizations:   . Attends Archivist Meetings:   Marland Kitchen Marital Status:   Intimate Partner Violence:   . Fear of Current or Ex-Partner:   . Emotionally Abused:   Marland Kitchen Physically Abused:   . Sexually Abused:       Objective:    BP 135/67   Pulse 70   Temp 98 F (36.7 C) (Temporal)   Ht '5\' 9"'$  (1.753 m)   Wt 152 lb 9.6 oz (69.2 kg)   SpO2 98%   BMI 22.54 kg/m   Wt Readings from Last 3 Encounters:  10/17/19 152 lb 9.6 oz (69.2 kg)  07/24/19 150 lb (68 kg)  04/17/19 149 lb (67.6 kg)    Physical Exam Vitals reviewed.  Constitutional:      General: He is not in acute distress.    Appearance: Normal appearance. He is normal weight. He is not ill-appearing, toxic-appearing or diaphoretic.  HENT:     Head: Normocephalic and atraumatic.     Right Ear: Tympanic membrane, ear canal and external ear normal. There is no impacted cerumen.     Left Ear: Tympanic membrane, ear canal and external ear normal. There is no impacted cerumen.     Nose: Nose normal. No congestion or rhinorrhea.     Mouth/Throat:     Mouth: Mucous  membranes are moist.     Pharynx: Oropharynx is clear. No oropharyngeal exudate or posterior oropharyngeal erythema.  Eyes:     General: No scleral icterus.       Right eye: No discharge.        Left eye: No discharge.     Conjunctiva/sclera: Conjunctivae normal.     Pupils: Pupils are equal, round, and reactive to light.  Neck:     Vascular: No carotid bruit.  Cardiovascular:     Rate and Rhythm: Normal rate and regular rhythm.     Heart sounds: Normal heart sounds. No murmur. No friction rub. No gallop.   Pulmonary:     Effort: Pulmonary effort is normal. No respiratory distress.     Breath sounds: Normal breath sounds. No stridor. No wheezing, rhonchi or rales.  Abdominal:     General: Abdomen is flat. Bowel sounds are normal. There is no distension.     Palpations: Abdomen is soft. There is no mass.     Tenderness: There is no abdominal tenderness. There is no guarding or rebound.     Hernia: No hernia is present.  Musculoskeletal:        General: Normal range of motion.     Cervical back: Normal range of motion and neck supple. No rigidity. No muscular tenderness.     Right lower leg: No edema.     Left lower leg: No edema.  Lymphadenopathy:     Cervical: No cervical adenopathy.  Skin:    General: Skin is warm and dry.     Capillary Refill: Capillary refill takes less than 2 seconds.     Comments: Brown, rough, irregular shaped area to the right side of his forehead.   Neurological:     General: No focal deficit present.     Mental Status: He is alert and oriented to person, place, and time. Mental status is at baseline.  Psychiatric:        Mood and Affect: Mood normal.        Behavior: Behavior normal.        Thought Content: Thought content normal.  Judgment: Judgment normal.    Lab Results  Component Value Date   WBC 12.6 (H) 04/17/2019   HGB 13.9 04/17/2019   HCT 42.7 04/17/2019   MCV 91 04/17/2019   PLT 273 04/17/2019   Lab Results  Component Value  Date   NA 140 04/17/2019   K 3.6 04/17/2019   CO2 23 04/17/2019   GLUCOSE 96 04/17/2019   BUN 8 04/17/2019   CREATININE 0.85 04/17/2019   BILITOT 0.3 04/17/2019   ALKPHOS 125 (H) 04/17/2019   AST 14 04/17/2019   ALT 9 04/17/2019   PROT 6.5 04/17/2019   ALBUMIN 4.3 04/17/2019   CALCIUM 9.4 04/17/2019   Lab Results  Component Value Date   CHOL 156 04/17/2019   Lab Results  Component Value Date   HDL 44 04/17/2019   Lab Results  Component Value Date   LDLCALC 97 04/17/2019   Lab Results  Component Value Date   TRIG 75 04/17/2019   Lab Results  Component Value Date   CHOLHDL 3.5 04/17/2019   No results found for: HGBA1C

## 2019-10-17 ENCOUNTER — Encounter: Payer: Self-pay | Admitting: Family Medicine

## 2019-10-17 ENCOUNTER — Ambulatory Visit (INDEPENDENT_AMBULATORY_CARE_PROVIDER_SITE_OTHER): Payer: BC Managed Care – PPO | Admitting: Family Medicine

## 2019-10-17 ENCOUNTER — Other Ambulatory Visit: Payer: Self-pay

## 2019-10-17 VITALS — BP 135/67 | HR 70 | Temp 98.0°F | Ht 69.0 in | Wt 152.6 lb

## 2019-10-17 DIAGNOSIS — I25119 Atherosclerotic heart disease of native coronary artery with unspecified angina pectoris: Secondary | ICD-10-CM

## 2019-10-17 DIAGNOSIS — Z125 Encounter for screening for malignant neoplasm of prostate: Secondary | ICD-10-CM

## 2019-10-17 DIAGNOSIS — Z0000 Encounter for general adult medical examination without abnormal findings: Secondary | ICD-10-CM | POA: Diagnosis not present

## 2019-10-17 DIAGNOSIS — K580 Irritable bowel syndrome with diarrhea: Secondary | ICD-10-CM

## 2019-10-17 DIAGNOSIS — L989 Disorder of the skin and subcutaneous tissue, unspecified: Secondary | ICD-10-CM

## 2019-10-17 DIAGNOSIS — Z0001 Encounter for general adult medical examination with abnormal findings: Secondary | ICD-10-CM | POA: Diagnosis not present

## 2019-10-17 DIAGNOSIS — Z23 Encounter for immunization: Secondary | ICD-10-CM

## 2019-10-17 DIAGNOSIS — E785 Hyperlipidemia, unspecified: Secondary | ICD-10-CM

## 2019-10-17 MED ORDER — SHINGRIX 50 MCG/0.5ML IM SUSR: 0.5000 mL | Freq: Once | INTRAMUSCULAR | 0 refills | Status: AC

## 2019-10-17 MED ORDER — HYOSCYAMINE SULFATE ER 0.375 MG PO TB12: 0.3750 mg | ORAL_TABLET | Freq: Every day | ORAL | 3 refills | Status: DC

## 2019-10-17 MED ORDER — PRAVASTATIN SODIUM 40 MG PO TABS: 40.0000 mg | ORAL_TABLET | Freq: Every evening | ORAL | 3 refills | Status: DC

## 2019-10-18 LAB — LIPID PANEL
Chol/HDL Ratio: 2.8 ratio (ref 0.0–5.0)
Cholesterol, Total: 128 mg/dL (ref 100–199)
HDL: 46 mg/dL (ref >39)
LDL Chol Calc (NIH): 69 mg/dL (ref 0–99)
Triglycerides: 58 mg/dL (ref 0–149)
VLDL Cholesterol Cal: 13 mg/dL (ref 5–40)

## 2019-10-18 LAB — PSA, TOTAL AND FREE
PSA, Free Pct: 12.5 %
PSA, Free: 0.25 ng/mL
Prostate Specific Ag, Serum: 2 ng/mL (ref 0.0–4.0)

## 2019-10-18 LAB — CBC WITH DIFFERENTIAL/PLATELET
Basophils Absolute: 0 10*3/uL (ref 0.0–0.2)
Basos: 0 %
EOS (ABSOLUTE): 0.3 10*3/uL (ref 0.0–0.4)
Eos: 3 %
Hematocrit: 39.9 % (ref 37.5–51.0)
Hemoglobin: 13.6 g/dL (ref 13.0–17.7)
Immature Grans (Abs): 0 10*3/uL (ref 0.0–0.1)
Immature Granulocytes: 0 %
Lymphocytes Absolute: 3.5 10*3/uL — ABNORMAL HIGH (ref 0.7–3.1)
Lymphs: 36 %
MCH: 29.9 pg (ref 26.6–33.0)
MCHC: 34.1 g/dL (ref 31.5–35.7)
MCV: 88 fL (ref 79–97)
Monocytes Absolute: 0.7 10*3/uL (ref 0.1–0.9)
Monocytes: 7 %
Neutrophils Absolute: 5 10*3/uL (ref 1.4–7.0)
Neutrophils: 54 %
Platelets: 294 10*3/uL (ref 150–450)
RBC: 4.55 x10E6/uL (ref 4.14–5.80)
RDW: 13.5 % (ref 11.6–15.4)
WBC: 9.6 10*3/uL (ref 3.4–10.8)

## 2019-10-18 LAB — CMP14+EGFR
ALT: 7 IU/L (ref 0–44)
AST: 17 IU/L (ref 0–40)
Albumin/Globulin Ratio: 2 (ref 1.2–2.2)
Albumin: 4.2 g/dL (ref 3.8–4.8)
Alkaline Phosphatase: 133 IU/L — ABNORMAL HIGH (ref 39–117)
BUN/Creatinine Ratio: 5 — ABNORMAL LOW (ref 10–24)
BUN: 5 mg/dL — ABNORMAL LOW (ref 8–27)
Bilirubin Total: 0.3 mg/dL (ref 0.0–1.2)
CO2: 25 mmol/L (ref 20–29)
Calcium: 9.5 mg/dL (ref 8.6–10.2)
Chloride: 102 mmol/L (ref 96–106)
Creatinine, Ser: 0.92 mg/dL (ref 0.76–1.27)
GFR calc Af Amer: 103 mL/min/{1.73_m2} (ref >59)
GFR calc non Af Amer: 89 mL/min/{1.73_m2} (ref >59)
Globulin, Total: 2.1 g/dL (ref 1.5–4.5)
Glucose: 82 mg/dL (ref 65–99)
Potassium: 3.8 mmol/L (ref 3.5–5.2)
Sodium: 141 mmol/L (ref 134–144)
Total Protein: 6.3 g/dL (ref 6.0–8.5)

## 2019-10-21 ENCOUNTER — Encounter: Payer: Self-pay | Admitting: Family Medicine

## 2019-10-30 DIAGNOSIS — Z23 Encounter for immunization: Secondary | ICD-10-CM | POA: Diagnosis not present

## 2019-11-21 DIAGNOSIS — Z23 Encounter for immunization: Secondary | ICD-10-CM | POA: Diagnosis not present

## 2020-09-07 ENCOUNTER — Ambulatory Visit (INDEPENDENT_AMBULATORY_CARE_PROVIDER_SITE_OTHER): Payer: 59 | Admitting: Family Medicine

## 2020-09-07 ENCOUNTER — Encounter: Payer: Self-pay | Admitting: Family Medicine

## 2020-09-07 VITALS — BP 134/86 | HR 83 | Temp 97.6°F | Resp 20 | Ht 69.0 in | Wt 152.0 lb

## 2020-09-07 DIAGNOSIS — J329 Chronic sinusitis, unspecified: Secondary | ICD-10-CM

## 2020-09-07 DIAGNOSIS — J40 Bronchitis, not specified as acute or chronic: Secondary | ICD-10-CM | POA: Diagnosis not present

## 2020-09-07 MED ORDER — AZITHROMYCIN 250 MG PO TABS: ORAL_TABLET | ORAL | 0 refills | Status: DC

## 2020-09-07 MED ORDER — DEXAMETHASONE 6 MG PO TABS: 6.0000 mg | ORAL_TABLET | Freq: Two times a day (BID) | ORAL | 0 refills | Status: DC

## 2020-09-07 NOTE — Progress Notes (Signed)
Chief Complaint  Patient presents with  . Cough    HPI  Patient presents today for Patient presents with upper respiratory congestion. Rhinorrhea that is frequently purulent. There is mild sore throat. Patient reports coughing frequently as well.  clear sputum noted. There is no fever, chills or sweats. The patient denies being short of breath. Onset was 2 days ago. Gradually worsening. Tried OTCs without improvement.   PMH: Smoking status noted ROS: Per HPI  Objective: BP 134/86   Pulse 83   Temp 97.6 F (36.4 C) (Temporal)   Resp 20   Ht 5\' 9"  (1.753 m)   Wt 152 lb (68.9 kg)   SpO2 99%   BMI 22.45 kg/m  Gen: NAD, alert, cooperative with exam HEENT: NCAT, EOMI, PERRL CV: RRR, good S1/S2, no murmur Resp: diminished exp. Phase with faint respirations.  Abd: SNTND, BS present, no guarding or organomegaly Ext: No edema, warm Neuro: Alert and oriented, No gross deficits  Assessment and plan:  1. Sinobronchitis     Meds ordered this encounter  Medications  . dexamethasone (DECADRON) 6 MG tablet    Sig: Take 1 tablet (6 mg total) by mouth 2 (two) times daily with a meal.    Dispense:  10 tablet    Refill:  0  . azithromycin (ZITHROMAX Z-PAK) 250 MG tablet    Sig: Take two right away Then one a day for the next 4 days.    Dispense:  6 each    Refill:  0    Orders Placed This Encounter  Procedures  . Novel Coronavirus, NAA ([facility])    Order Specific Question:   Is this test for diagnosis or screening    Answer:   Diagnosis of ill patient    Order Specific Question:   Symptomatic for COVID-19 as defined by CDC    Answer:   Yes    Order Specific Question:   Date of Symptom Onset    Answer:   09/05/2020    Order Specific Question:   Hospitalized for COVID-19    Answer:   No    Order Specific Question:   Admitted to ICU for COVID-19    Answer:   No    Order Specific Question:   Previously tested for COVID-19    Answer:   No    Order Specific Question:   Resident in  a congregate (group) care setting    Answer:   No    Order Specific Question:   Is the patient student?    Answer:   No    Order Specific Question:   Employed in healthcare setting    Answer:   Yes    Order Specific Question:   Has patient completed COVID vaccination(s) (2 doses of Pfizer/Moderna 1 dose of Johnson 09/07/2020)    Answer:   Yes    Order Specific Question:   Has patient completed COVID Booster / 3rd dose    Answer:   Yes    Order Specific Question:   Release to patient    Answer:   Immediate    Follow up as needed.  [employer], MD

## 2020-09-08 LAB — NOVEL CORONAVIRUS, NAA: SARS-CoV-2, NAA: DETECTED — AB

## 2020-09-08 LAB — SARS-COV-2, NAA 2 DAY TAT

## 2020-09-09 ENCOUNTER — Telehealth: Payer: Self-pay | Admitting: Unknown Physician Specialty

## 2020-09-09 MED ORDER — MOLNUPIRAVIR EUA 200MG CAPSULE: 4.0000 | ORAL_CAPSULE | Freq: Two times a day (BID) | ORAL | 0 refills | Status: AC

## 2020-09-09 NOTE — Progress Notes (Signed)
Patient aware of COVID test. Verbalized understanding.

## 2020-09-09 NOTE — Telephone Encounter (Signed)
Outpatient Oral COVID Treatment Note  I connected with [name] on 09/09/2020/4:08 PM by telephone and verified that I am speaking with the correct person using two identifiers.  I discussed the limitations, risks, security, and privacy concerns of performing an evaluation and management service by telephone and the availability of in person appointments. I also discussed with the patient that there may be a patient responsible charge related to this service. The patient expressed understanding and agreed to proceed.  Patient location: home Provider location: home  Diagnosis: COVID-19 infection  Purpose of visit: Discussion of potential use of Molnupiravir or Paxlovid, a new treatment for mild to moderate COVID-19 viral infection in non-hospitalized patients.   Subjective: Patient is a 65 y.o. male who has been diagnosed with COVID 19 viral infection.  Their symptoms began on 2/14 with ribcage pain.    Past Medical History:  Diagnosis Date  . CAD (coronary artery disease)    BMS to RCA May 2009, cutting balloon angioplasty for restenosis July 2009, then DES to RCA  August 2009.  Marland Kitchen History of inferior wall myocardial infarction 11/2007  . Hyperlipidemia   . IBS (irritable bowel syndrome)   . Loose body of right knee 08/2017  . Medial meniscus tear 08/2017   right knee  . MYOCARDIAL INFARCTION, INFERIOR WALL 11/27/2008   Qualifier: Diagnosis of  By: Genelle Gather CMA, [place]    . Wears dentures    upper    No Known Allergies   Current Outpatient Medications:  .  aspirin EC 81 MG tablet, Take 1 tablet (81 mg total) by mouth daily., Disp: 90 tablet, Rfl: 3 .  azithromycin (ZITHROMAX Z-PAK) 250 MG tablet, Take two right away Then one a day for the next 4 days., Disp: 6 each, Rfl: 0 .  dexamethasone (DECADRON) 6 MG tablet, Take 1 tablet (6 mg total) by mouth 2 (two) times daily with a meal., Disp: 10 tablet, Rfl: 0 .  hyoscyamine (LEVBID) 0.375 MG 12 hr tablet, Take 1 tablet (0.375 mg total)  by mouth daily. (Patient not taking: Reported on 09/07/2020), Disp: 90 tablet, Rfl: 3 .  pravastatin (PRAVACHOL) 40 MG tablet, Take 1 tablet (40 mg total) by mouth every evening. (Patient not taking: Reported on 09/07/2020), Disp: 90 tablet, Rfl: 3  Objective: Patient appears/sounds well.  They are in no apparent distress.  Breathing is non labored.  Mood and behavior are normal.  Laboratory Data:  Recent Results (from the past 2160 hour(s))  Novel Coronavirus, NAA ([facility])     Status: Abnormal   Collection Time: 09/07/20  4:40 PM   Specimen: Nasopharyngeal(NP) swabs in vial transport medium  Result Value Ref Range   SARS-CoV-2, NAA Detected (A) Not Detected    Comment: Patients who have a positive COVID-19 test result may now have treatment options. Treatment options are available for patients with mild to moderate symptoms and for hospitalized patients. Visit our website at [email or web address] for resources and information. This nucleic acid amplification test was developed and its performance characteristics determined by [employer]. Nucleic acid amplification tests include RT-PCR and TMA. This test has not been FDA cleared or approved. This test has been authorized by FDA under an Emergency Use Authorization (EUA). This test is only authorized for the duration of time the declaration that circumstances exist justifying the authorization of the emergency use of in vitro diagnostic tests for detection of SARS-CoV-2 virus and/or diagnosis of COVID-19 infection under section 564(b)(1) of the Act, 21 U.S.C. 063KZS-0(F) (1), unless  the authorization is terminated or revoked sooner. When diagnostic testing is negativ e, the possibility of a false negative result should be considered in the context of a patient's recent exposures and the presence of clinical signs and symptoms consistent with COVID-19. An individual without symptoms of COVID-19 and who is not shedding  SARS-CoV-2 virus would expect to have a negative (not detected) result in this assay.   SARS-COV-2, NAA 2 DAY TAT     Status: None   Collection Time: 09/07/20  4:40 PM  Result Value Ref Range   SARS-CoV-2, NAA 2 DAY TAT Performed      Assessment: 65 y.o. male with mild/moderate COVID 19 viral infection diagnosed on 2/14 at high risk for progression to severe COVID 19.  Plan:  This patient is a 65 y.o. male that meets the following criteria for Emergency Use Authorization of: Molnupiravir  1. Age >18 yr 2. SARS-COV-2 positive test 3. Symptom onset < 5 days 4. Mild-to-moderate COVID disease with high risk for severe progression to hospitalization or death   I have spoken and communicated the following to the patient or parent/caregiver regarding: 1. Molnupiravir is an unapproved drug that is authorized for use under an TEFL teacher.  2. There are no adequate, approved, available products for the treatment of COVID-19 in adults who have mild-to-moderate COVID-19 and are at high risk for progressing to severe COVID-19, including hospitalization or death. 3. Other therapeutics are currently authorized. For additional information on all products authorized for treatment or prevention of COVID-19, please see [email or web address]://[email or web address].graham-[email or web address]  4. There are benefits and risks of taking this treatment as outlined in the "Fact Sheet for Patients and Caregivers."  5. "Fact Sheet for Patients and Caregivers" was reviewed with patient. A hard copy will be provided to patient from pharmacy prior to the patient receiving treatment. 6. Patients should continue to self-isolate and use infection control measures (e.g., wear mask, isolate, social distance, avoid sharing personal items, clean and disinfect "high touch" surfaces, and frequent handwashing) according to CDC guidelines.  7. The patient or  parent/caregiver has the option to accept or refuse treatment. 8. [employer] has established a pregnancy surveillance program. 9. Females of childbearing potential should use a reliable method of contraception correctly and consistently, as applicable, for the duration of treatment and for 4 days after the last dose of Molnupiravir. 10. Males of reproductive potential who are sexually active with females of childbearing potential should use a reliable method of contraception correctly and consistently during treatment and for at least 3 months after the last dose. 11. Pregnancy status and risk was assessed. Patient verbalized understanding of precautions.   After reviewing above information with the patient, the patient agrees to receive molnupiravir.  Follow up instructions:    . Take prescription BID x 5 days as directed . Reach out to pharmacist for counseling on medication if desired . For concerns regarding further COVID symptoms please follow up with your PCP or urgent care . For urgent or life-threatening issues, seek care at your local emergency department  The patient was provided an opportunity to ask questions, and all were answered. The patient agreed with the plan and demonstrated an understanding of the instructions.   Script sent to [facility]  and opted to pick up at local pharmacy  The patient was advised to call their PCP or seek an in-person evaluation if the symptoms worsen or if the condition fails to improve as anticipated.   I provided  15 minutes of non face-to-face telephone visit time during this encounter, and > 50% was spent counseling as documented under my assessment & plan.  Gabriel Cirri, NP 09/09/2020 Ellin Goodie PM

## 2020-09-10 ENCOUNTER — Telehealth: Payer: Self-pay

## 2020-09-10 NOTE — Telephone Encounter (Signed)
Patient aware and verbalized understanding. Patient aware that is the antiviral treatment for COVID

## 2020-09-15 ENCOUNTER — Telehealth: Payer: Self-pay

## 2020-09-15 NOTE — Telephone Encounter (Signed)
Spoke to patient - he states that he feels great - slight cough in the mornings - denies any SOB.  Patient finished molnupiravir 200 mg last night.  Please advise if patient needs to do anything else at this point

## 2020-09-15 NOTE — Telephone Encounter (Signed)
He doesn't need prescriptions. He should rest a lot, avoid stressful, strenuous activity. He can use OTC URI meds as needed.

## 2020-09-15 NOTE — Telephone Encounter (Signed)
Pt aware of provider feedback and voiced understanding. 

## 2020-11-18 ENCOUNTER — Encounter: Payer: Self-pay | Admitting: Family Medicine

## 2020-11-18 ENCOUNTER — Other Ambulatory Visit: Payer: Self-pay

## 2020-11-18 ENCOUNTER — Ambulatory Visit (INDEPENDENT_AMBULATORY_CARE_PROVIDER_SITE_OTHER): Payer: 59 | Admitting: Family Medicine

## 2020-11-18 VITALS — BP 160/95 | HR 94 | Temp 97.9°F | Ht 69.0 in | Wt 161.0 lb

## 2020-11-18 DIAGNOSIS — K580 Irritable bowel syndrome with diarrhea: Secondary | ICD-10-CM | POA: Diagnosis not present

## 2020-11-18 DIAGNOSIS — R030 Elevated blood-pressure reading, without diagnosis of hypertension: Secondary | ICD-10-CM | POA: Insufficient documentation

## 2020-11-18 DIAGNOSIS — E785 Hyperlipidemia, unspecified: Secondary | ICD-10-CM

## 2020-11-18 DIAGNOSIS — Z0001 Encounter for general adult medical examination with abnormal findings: Secondary | ICD-10-CM | POA: Diagnosis not present

## 2020-11-18 DIAGNOSIS — I25119 Atherosclerotic heart disease of native coronary artery with unspecified angina pectoris: Secondary | ICD-10-CM

## 2020-11-18 DIAGNOSIS — Z0000 Encounter for general adult medical examination without abnormal findings: Secondary | ICD-10-CM

## 2020-11-18 MED ORDER — PRAVASTATIN SODIUM 40 MG PO TABS: 40.0000 mg | ORAL_TABLET | Freq: Every evening | ORAL | 3 refills | Status: DC

## 2020-11-18 MED ORDER — ASPIRIN EC 81 MG PO TBEC: 81.0000 mg | DELAYED_RELEASE_TABLET | Freq: Every day | ORAL | 3 refills | Status: DC

## 2020-11-18 NOTE — Patient Instructions (Signed)
Melatonin (up to 10 mg), Unisom, or Zzzquil for sleep.   Preventive Care 84-64 Years Old, Male Preventive care refers to lifestyle choices and visits with your health care provider that can promote health and wellness. This includes:  A yearly physical exam. This is also called an annual wellness visit.  Regular dental and eye exams.  Immunizations.  Screening for certain conditions.  Healthy lifestyle choices, such as: ? Eating a healthy diet. ? Getting regular exercise. ? Not using drugs or products that contain nicotine and tobacco. ? Limiting alcohol use. What can I expect for my preventive care visit? Physical exam Your health care provider will check your:  Height and weight. These may be used to calculate your BMI (body mass index). BMI is a measurement that tells if you are at a healthy weight.  Heart rate and blood pressure.  Body temperature.  Skin for abnormal spots. Counseling Your health care provider may ask you questions about your:  Past medical problems.  Family's medical history.  Alcohol, tobacco, and drug use.  Emotional well-being.  Home life and relationship well-being.  Sexual activity.  Diet, exercise, and sleep habits.  Work and work Astronomer.  Access to firearms. What immunizations do I need? Vaccines are usually given at various ages, according to a schedule. Your health care provider will recommend vaccines for you based on your age, medical history, and lifestyle or other factors, such as travel or where you work.   What tests do I need? Blood tests  Lipid and cholesterol levels. These may be checked every 5 years, or more often if you are over 64 years old.  Hepatitis C test.  Hepatitis B test. Screening  Lung cancer screening. You may have this screening every year starting at age 64 if you have a 30-pack-year history of smoking and currently smoke or have quit within the past 15 years.  Prostate cancer screening.  Recommendations will vary depending on your family history and other risks.  Genital exam to check for testicular cancer or hernias.  Colorectal cancer screening. ? All adults should have this screening starting at age 64 and continuing until age 11. ? Your health care provider may recommend screening at age 94 if you are at increased risk. ? You will have tests every 1-10 years, depending on your results and the type of screening test.  Diabetes screening. ? This is done by checking your blood sugar (glucose) after you have not eaten for a while (fasting). ? You may have this done every 1-3 years.  STD (sexually transmitted disease) testing, if you are at risk. Follow these instructions at home: Eating and drinking  Eat a diet that includes fresh fruits and vegetables, whole grains, lean protein, and low-fat dairy products.  Take vitamin and mineral supplements as recommended by your health care provider.  Do not drink alcohol if your health care provider tells you not to drink.  If you drink alcohol: ? Limit how much you have to 0-2 drinks a day. ? Be aware of how much alcohol is in your drink. In the [place] one drink equals one 12 oz bottle of beer (355 mL), one 5 oz glass of wine (148 mL), or one 1 oz glass of hard liquor (44 mL).   Lifestyle  Take daily care of your teeth and gums. Brush your teeth every morning and night with fluoride toothpaste. Floss one time each day.  Stay active. Exercise for at least 30 minutes 5 or more days  each week.  Do not use any products that contain nicotine or tobacco, such as cigarettes, e-cigarettes, and chewing tobacco. If you need help quitting, ask your health care provider.  Do not use drugs.  If you are sexually active, practice safe sex. Use a condom or other form of protection to prevent STIs (sexually transmitted infections).  If told by your health care provider, take low-dose aspirin daily starting at age 73.  Find healthy ways  to cope with stress, such as: ? Meditation, yoga, or listening to music. ? Journaling. ? Talking to a trusted person. ? Spending time with friends and family. Safety  Always wear your seat belt while driving or riding in a vehicle.  Do not drive: ? If you have been drinking alcohol. Do not ride with someone who has been drinking. ? When you are tired or distracted. ? While texting.  Wear a helmet and other protective equipment during sports activities.  If you have firearms in your house, make sure you follow all gun safety procedures. What's next?  Go to your health care provider once a year for an annual wellness visit.  Ask your health care provider how often you should have your eyes and teeth checked.  Stay up to date on all vaccines. This information is not intended to replace advice given to you by your health care provider. Make sure you discuss any questions you have with your health care provider. Document Revised: 04/08/2019 Document Reviewed: 07/04/2018 Elsevier Patient Education  2021 [employer].

## 2020-11-18 NOTE — Progress Notes (Signed)
Assessment & Plan:  1. Well adult exam Preventive health education provided.  Patient declined Shingrix and HIV screening. - CBC with Differential/Platelet - CMP14+EGFR - Lipid panel  2. Dyslipidemia Labs to assess.  He has been off of pravastatin x1 year.  Restarted today. - Lipid panel - pravastatin (PRAVACHOL) 40 MG tablet; Take 1 tablet (40 mg total) by mouth every evening.  Dispense: 90 tablet; Refill: 3  3. Coronary artery disease involving native coronary artery of native heart with angina pectoris (HCC) Restarted pravastatin and aspirin today. - Lipid panel - aspirin EC 81 MG tablet; Take 1 tablet (81 mg total) by mouth daily.  Dispense: 90 tablet; Refill: 3 - pravastatin (PRAVACHOL) 40 MG tablet; Take 1 tablet (40 mg total) by mouth every evening.  Dispense: 90 tablet; Refill: 3  4. Irritable bowel syndrome with diarrhea Patient has not been having any issues since he retired.  5. Elevated BP without diagnosis of hypertension Advised to keep a log of his blood pressures and bring it back with him to his next appointment.   Follow-up: Return in about 6 weeks (around 12/30/2020) for HTN.   [name], MSN, APRN, FNP-C [facility]  Subjective:  Patient ID: [name], male    DOB: 1956/04/22  Age: 65 y.o. MRN: [id number]  Patient Care Team: Gwenlyn Fudge, FNP as PCP - General (Family Medicine) Jonelle Sidle, MD as PCP - Cardiology (Cardiology)   CC:  Chief Complaint  Patient presents with  . Annual Exam    HPI [name] presents for his annual physical.   Occupation: Retired, Marital status: widowed, Substance use: none Diet: regular, Exercise: staying busy Last eye exam: February 2021 Last dental exam: "a while" Last colonoscopy: 01/16/2014 Lung Cancer Screening with low-dose Chest CT: declined Hepatitis C Screening: completed 04/17/2019 PSA: WNL 10/17/2019 Immunizations: Flu Vaccine: not flu season Tdap Vaccine: up to  date  Shingrix Vaccine: declined  COVID-19 Vaccine: up to date  DEPRESSION SCREENING PHQ 2/9 Scores 09/07/2020 10/17/2019 04/17/2019  PHQ - 2 Score 0 0 2  PHQ- 9 Score - - 4    Patient reports he has been off of all of his medications for the past year as he has not had insurance.  He has not been having any issues with his IBS since he retired last year.   Review of Systems  Constitutional: Negative for chills, fever, malaise/fatigue and weight loss.  HENT: Negative for congestion, ear discharge, ear pain, nosebleeds, sinus pain, sore throat and tinnitus.   Eyes: Negative for blurred vision, double vision, pain, discharge and redness.  Respiratory: Negative for cough, shortness of breath and wheezing.   Cardiovascular: Negative for chest pain, palpitations and leg swelling.  Gastrointestinal: Negative for abdominal pain, constipation, diarrhea, heartburn, nausea and vomiting.  Genitourinary: Negative for dysuria, frequency and urgency.       Denies trouble initiating a urine stream, weak stream, split stream, and dribbling.   Musculoskeletal: Negative for myalgias.  Skin: Negative for rash.  Neurological: Negative for dizziness, seizures, weakness and headaches.  Psychiatric/Behavioral: Negative for depression, substance abuse and suicidal ideas. The patient is not nervous/anxious.     Current Outpatient Medications:  .  aspirin EC 81 MG tablet, Take 1 tablet (81 mg total) by mouth daily., Disp: 90 tablet, Rfl: 3 .  pravastatin (PRAVACHOL) 40 MG tablet, Take 1 tablet (40 mg total) by mouth every evening., Disp: 90 tablet, Rfl: 3  No Known Allergies  Past  Medical History:  Diagnosis Date  . CAD (coronary artery disease)    BMS to RCA May 2009, cutting balloon angioplasty for restenosis July 2009, then DES to RCA  August 2009.  Marland Kitchen History of inferior wall myocardial infarction 11/2007  . Hyperlipidemia   . IBS (irritable bowel syndrome)   . Loose body of right knee 08/2017  .  Medial meniscus tear 08/2017   right knee  . MYOCARDIAL INFARCTION, INFERIOR WALL 11/27/2008   Qualifier: Diagnosis of  By: [place], [place]    . Wears dentures    upper    Past Surgical History:  Procedure Laterality Date  . APPENDECTOMY  07/12/2002  . CARDIAC CATHETERIZATION  11/22/2007   stent RCA  . CARDIAC CATHETERIZATION  02/21/2008   restenosis RCA - angioplasty  . CARDIAC CATHETERIZATION  02/28/2008   drug-eluting stent RCA  . CHONDROPLASTY Right 09/13/2017   Procedure: CHONDROPLASTY RIGHT KNEE;  Surgeon: Hiram Gash, MD;  Location: [facility];  Service: Orthopedics;  Laterality: Right;  . FOOT SURGERY Right   . KNEE ARTHROSCOPY Right   . KNEE ARTHROSCOPY WITH MEDIAL MENISECTOMY Right 09/13/2017   Procedure: RIGHT KNEE ARTHROSCOPY WITH PARTIAL MEDIAL MENISCECTOMY;  Surgeon: Hiram Gash, MD;  Location: [place];  Service: Orthopedics;  Laterality: Right;    Family History  Problem Relation Age of Onset  . Heart attack Father        h/o CABG and died of massive heart attackat age 24  . Thyroid disease Sister     Social History   Socioeconomic History  . Marital status: Widowed    Spouse name: Not on file  . Number of children: Not on file  . Years of education: Not on file  . Highest education level: Not on file  Occupational History  . Occupation: Works at a plant  . Occupation: [employer], occupational  . Occupation: Builds bird houses  Tobacco Use  . Smoking status: Current Every Day Smoker    Years: 30.00    Types: Cigarettes  . Smokeless tobacco: Never Used  . Tobacco comment: 1 pack/3 days  Vaping Use  . Vaping Use: Never used  Substance and Sexual Activity  . Alcohol use: No  . Drug use: No  . Sexual activity: Not Currently  Other Topics Concern  . Not on file  Social History Narrative  . Not on file   Social Determinants of Health   Financial Resource Strain: Not on file  Food Insecurity: Not on file   Transportation Needs: Not on file  Physical Activity: Not on file  Stress: Not on file  Social Connections: Not on file  Intimate Partner Violence: Not on file      Objective:    BP (!) 160/95   Pulse 94   Temp 97.9 F (36.6 C) (Temporal)   Ht $R'5\' 9"'Ez$  (1.753 m)   Wt 161 lb (73 kg)   SpO2 97%   BMI 23.78 kg/m   Wt Readings from Last 3 Encounters:  11/18/20 161 lb (73 kg)  09/07/20 152 lb (68.9 kg)  10/17/19 152 lb 9.6 oz (69.2 kg)    Physical Exam Vitals reviewed.  Constitutional:      General: He is not in acute distress.    Appearance: Normal appearance. He is normal weight. He is not ill-appearing, toxic-appearing or diaphoretic.  HENT:     Head: Normocephalic and atraumatic.     Right Ear: Tympanic membrane, ear canal and  external ear normal. There is no impacted cerumen.     Left Ear: Tympanic membrane, ear canal and external ear normal. There is no impacted cerumen.     Nose: Nose normal. No congestion or rhinorrhea.     Mouth/Throat:     Mouth: Mucous membranes are moist.     Pharynx: Oropharynx is clear. No oropharyngeal exudate or posterior oropharyngeal erythema.  Eyes:     General: No scleral icterus.       Right eye: No discharge.        Left eye: No discharge.     Conjunctiva/sclera: Conjunctivae normal.     Pupils: Pupils are equal, round, and reactive to light.  Neck:     Vascular: No carotid bruit.  Cardiovascular:     Rate and Rhythm: Normal rate and regular rhythm.     Heart sounds: Normal heart sounds. No murmur heard. No friction rub. No gallop.   Pulmonary:     Effort: Pulmonary effort is normal. No respiratory distress.     Breath sounds: Normal breath sounds. No stridor. No wheezing, rhonchi or rales.  Abdominal:     General: Abdomen is flat. Bowel sounds are normal. There is no distension.     Palpations: Abdomen is soft. There is no mass.     Tenderness: There is no abdominal tenderness. There is no guarding or rebound.     Hernia: No  hernia is present.  Musculoskeletal:        General: Normal range of motion.     Cervical back: Normal range of motion and neck supple. No rigidity. No muscular tenderness.     Right lower leg: No edema.     Left lower leg: No edema.  Lymphadenopathy:     Cervical: No cervical adenopathy.  Skin:    General: Skin is warm and dry.     Capillary Refill: Capillary refill takes less than 2 seconds.  Neurological:     General: No focal deficit present.     Mental Status: He is alert and oriented to person, place, and time. Mental status is at baseline.  Psychiatric:        Mood and Affect: Mood normal.        Behavior: Behavior normal.        Thought Content: Thought content normal.        Judgment: Judgment normal.    Lab Results  Component Value Date   WBC 9.6 10/17/2019   HGB 13.6 10/17/2019   HCT 39.9 10/17/2019   MCV 88 10/17/2019   PLT 294 10/17/2019   Lab Results  Component Value Date   NA 141 10/17/2019   K 3.8 10/17/2019   CO2 25 10/17/2019   GLUCOSE 82 10/17/2019   BUN 5 (L) 10/17/2019   CREATININE 0.92 10/17/2019   BILITOT 0.3 10/17/2019   ALKPHOS 133 (H) 10/17/2019   AST 17 10/17/2019   ALT 7 10/17/2019   PROT 6.3 10/17/2019   ALBUMIN 4.2 10/17/2019   CALCIUM 9.5 10/17/2019   Lab Results  Component Value Date   CHOL 128 10/17/2019   Lab Results  Component Value Date   HDL 46 10/17/2019   Lab Results  Component Value Date   LDLCALC 69 10/17/2019   Lab Results  Component Value Date   TRIG 58 10/17/2019   Lab Results  Component Value Date   CHOLHDL 2.8 10/17/2019   No results found for: HGBA1C

## 2020-11-19 LAB — CBC WITH DIFFERENTIAL/PLATELET
Basophils Absolute: 0.1 10*3/uL (ref 0.0–0.2)
Basos: 1 %
EOS (ABSOLUTE): 0.2 10*3/uL (ref 0.0–0.4)
Eos: 2 %
Hematocrit: 44.4 % (ref 37.5–51.0)
Hemoglobin: 14.9 g/dL (ref 13.0–17.7)
Immature Grans (Abs): 0 10*3/uL (ref 0.0–0.1)
Immature Granulocytes: 0 %
Lymphocytes Absolute: 3.6 10*3/uL — ABNORMAL HIGH (ref 0.7–3.1)
Lymphs: 35 %
MCH: 29.4 pg (ref 26.6–33.0)
MCHC: 33.6 g/dL (ref 31.5–35.7)
MCV: 88 fL (ref 79–97)
Monocytes Absolute: 0.7 10*3/uL (ref 0.1–0.9)
Monocytes: 7 %
Neutrophils Absolute: 5.6 10*3/uL (ref 1.4–7.0)
Neutrophils: 55 %
Platelets: 286 10*3/uL (ref 150–450)
RBC: 5.06 x10E6/uL (ref 4.14–5.80)
RDW: 14.4 % (ref 11.6–15.4)
WBC: 10.3 10*3/uL (ref 3.4–10.8)

## 2020-11-19 LAB — LIPID PANEL
Chol/HDL Ratio: 4.6 ratio (ref 0.0–5.0)
Cholesterol, Total: 189 mg/dL (ref 100–199)
HDL: 41 mg/dL (ref >39)
LDL Chol Calc (NIH): 135 mg/dL — ABNORMAL HIGH (ref 0–99)
Triglycerides: 72 mg/dL (ref 0–149)
VLDL Cholesterol Cal: 13 mg/dL (ref 5–40)

## 2020-11-19 LAB — CMP14+EGFR
ALT: 8 IU/L (ref 0–44)
AST: 14 IU/L (ref 0–40)
Albumin/Globulin Ratio: 1.8 (ref 1.2–2.2)
Albumin: 4.4 g/dL (ref 3.8–4.8)
Alkaline Phosphatase: 144 IU/L — ABNORMAL HIGH (ref 44–121)
BUN/Creatinine Ratio: 10 (ref 10–24)
BUN: 10 mg/dL (ref 8–27)
Bilirubin Total: 0.4 mg/dL (ref 0.0–1.2)
CO2: 21 mmol/L (ref 20–29)
Calcium: 9.4 mg/dL (ref 8.6–10.2)
Chloride: 103 mmol/L (ref 96–106)
Creatinine, Ser: 1.01 mg/dL (ref 0.76–1.27)
Globulin, Total: 2.4 g/dL (ref 1.5–4.5)
Glucose: 95 mg/dL (ref 65–99)
Potassium: 4.5 mmol/L (ref 3.5–5.2)
Sodium: 141 mmol/L (ref 134–144)
Total Protein: 6.8 g/dL (ref 6.0–8.5)
eGFR: 83 mL/min/{1.73_m2} (ref >59)

## 2021-01-06 ENCOUNTER — Encounter: Payer: Self-pay | Admitting: Family Medicine

## 2021-01-06 ENCOUNTER — Ambulatory Visit (INDEPENDENT_AMBULATORY_CARE_PROVIDER_SITE_OTHER): Payer: 59 | Admitting: Family Medicine

## 2021-01-06 ENCOUNTER — Other Ambulatory Visit: Payer: Self-pay

## 2021-01-06 VITALS — BP 139/89 | HR 79 | Temp 98.0°F | Ht 69.0 in | Wt 163.8 lb

## 2021-01-06 DIAGNOSIS — R030 Elevated blood-pressure reading, without diagnosis of hypertension: Secondary | ICD-10-CM

## 2021-01-06 DIAGNOSIS — M79641 Pain in right hand: Secondary | ICD-10-CM

## 2021-01-06 DIAGNOSIS — M79642 Pain in left hand: Secondary | ICD-10-CM | POA: Diagnosis not present

## 2021-01-08 NOTE — Progress Notes (Signed)
Assessment & Plan:  1. Elevated BP without diagnosis of hypertension Blood pressure readings look good at home. No medications started.  2. Pain in both hands Education provided on carpal tunnel. Encouraged patient to wear wrist splints at night to see if this resolves/improves his pain.   Return in about 3 months (around 04/08/2021) for follow-up of chronic medication conditions.  [name], MSN, APRN, FNP-C [facility]  Subjective:    Patient ID: [name], male    DOB: [birth year], 65 y.o.   MRN: [id number]  Patient Care Team: Gwenlyn Fudge, FNP as PCP - General (Family Medicine) Jonelle Sidle, MD as PCP - Cardiology (Cardiology)   Chief Complaint:  Chief Complaint  Patient presents with   Hypertension    6 week follow up    HPI: [name] is a 65 y.o. male presenting on 01/06/2021 for Blood Pressure Check (6 week follow up)  Patient is here to follow-up on his blood pressure. At our last visit his BP was elevated. He does not have a diagnosis of hypertension. He has been keeping a log at home. Systolic ranges 118-147 with 3/51 >140 and 20/51 between 130-139. Diastolic ranges 76-98 with 4/51 >90.  New complaints: He reports he has been having pain in his hands/wrists and believes he may have carpal tunnel.    Social history:  Relevant past medical, surgical, family and social history reviewed and updated as indicated. Interim medical history since our last visit reviewed.  Allergies and medications reviewed and updated.  DATA REVIEWED: CHART IN EPIC  ROS: Negative unless specifically indicated above in HPI.    Current Outpatient Medications:    aspirin EC 81 MG tablet, Take 1 tablet (81 mg total) by mouth daily., Disp: 90 tablet, Rfl: 3   pravastatin (PRAVACHOL) 40 MG tablet, Take 1 tablet (40 mg total) by mouth every evening., Disp: 90 tablet, Rfl: 3   No Known Allergies Past Medical History:  Diagnosis Date   CAD  (coronary artery disease)    BMS to RCA May 2009, cutting balloon angioplasty for restenosis July 2009, then DES to RCA  August 2009.   History of inferior wall myocardial infarction 11/2007   Hyperlipidemia    IBS (irritable bowel syndrome)    Loose body of right knee 08/2017   Medial meniscus tear 08/2017   right knee   MYOCARDIAL INFARCTION, INFERIOR WALL 11/27/2008   Qualifier: Diagnosis of  By: Genelle Gather CMA, [place]     Wears dentures    upper    Past Surgical History:  Procedure Laterality Date   APPENDECTOMY  07/12/2002   CARDIAC CATHETERIZATION  11/22/2007   stent RCA   CARDIAC CATHETERIZATION  02/21/2008   restenosis RCA - angioplasty   CARDIAC CATHETERIZATION  02/28/2008   drug-eluting stent RCA   CHONDROPLASTY Right 09/13/2017   Procedure: CHONDROPLASTY RIGHT KNEE;  Surgeon: Bjorn Pippin, MD;  Location: [facility];  Service: Orthopedics;  Laterality: Right;   FOOT SURGERY Right    KNEE ARTHROSCOPY Right    KNEE ARTHROSCOPY WITH MEDIAL MENISECTOMY Right 09/13/2017   Procedure: RIGHT KNEE ARTHROSCOPY WITH PARTIAL MEDIAL MENISCECTOMY;  Surgeon: Bjorn Pippin, MD;  Location: [facility];  Service: Orthopedics;  Laterality: Right;    Social History   Socioeconomic History   Marital status: Widowed    Spouse name: Not on file   Number of children: Not on file   Years of education: Not on file  Highest education level: Not on file  Occupational History   Occupation: Works at a plant   Occupation: [employer], occupational   Occupation: Builds bird houses  Tobacco Use   Smoking status: Every Day    Years: 30.00    Pack years: 0.00    Types: Cigarettes   Smokeless tobacco: Never   Tobacco comments:    1 pack/3 days  Vaping Use   Vaping Use: Never used  Substance and Sexual Activity   Alcohol use: No   Drug use: No   Sexual activity: Not Currently  Other Topics Concern   Not on file  Social History Narrative   Not on file   Social  Determinants of Health   Financial Resource Strain: Not on file  Food Insecurity: Not on file  Transportation Needs: Not on file  Physical Activity: Not on file  Stress: Not on file  Social Connections: Not on file  Intimate Partner Violence: Not on file        Objective:    BP 139/89   Pulse 79   Temp 98 F (36.7 C) (Temporal)   Ht $R'5\' 9"'zi$  (1.753 m)   Wt 163 lb 12.8 oz (74.3 kg)   SpO2 96%   BMI 24.19 kg/m   Wt Readings from Last 3 Encounters:  01/06/21 163 lb 12.8 oz (74.3 kg)  11/18/20 161 lb (73 kg)  09/07/20 152 lb (68.9 kg)    Physical Exam Vitals reviewed.  Constitutional:      General: He is not in acute distress.    Appearance: Normal appearance. He is normal weight. He is not ill-appearing, toxic-appearing or diaphoretic.  HENT:     Head: Normocephalic and atraumatic.  Eyes:     General: No scleral icterus.       Right eye: No discharge.        Left eye: No discharge.     Conjunctiva/sclera: Conjunctivae normal.  Cardiovascular:     Rate and Rhythm: Normal rate and regular rhythm.     Heart sounds: Normal heart sounds. No murmur heard.   No friction rub. No gallop.  Pulmonary:     Effort: Pulmonary effort is normal. No respiratory distress.     Breath sounds: Normal breath sounds. No stridor. No wheezing, rhonchi or rales.  Musculoskeletal:        General: Normal range of motion.     Cervical back: Normal range of motion.  Skin:    General: Skin is warm and dry.  Neurological:     Mental Status: He is alert and oriented to person, place, and time. Mental status is at baseline.  Psychiatric:        Mood and Affect: Mood normal.        Behavior: Behavior normal.        Thought Content: Thought content normal.        Judgment: Judgment normal.     Lab Results  Component Value Date   WBC 10.3 11/18/2020   HGB 14.9 11/18/2020   HCT 44.4 11/18/2020   MCV 88 11/18/2020   PLT 286 11/18/2020   Lab Results  Component Value Date   NA 141  11/18/2020   K 4.5 11/18/2020   CO2 21 11/18/2020   GLUCOSE 95 11/18/2020   BUN 10 11/18/2020   CREATININE 1.01 11/18/2020   BILITOT 0.4 11/18/2020   ALKPHOS 144 (H) 11/18/2020   AST 14 11/18/2020   ALT 8 11/18/2020   PROT 6.8 11/18/2020  ALBUMIN 4.4 11/18/2020   CALCIUM 9.4 11/18/2020   EGFR 83 11/18/2020   Lab Results  Component Value Date   CHOL 189 11/18/2020   Lab Results  Component Value Date   HDL 41 11/18/2020   Lab Results  Component Value Date   LDLCALC 135 (H) 11/18/2020   Lab Results  Component Value Date   TRIG 72 11/18/2020   Lab Results  Component Value Date   CHOLHDL 4.6 11/18/2020   No results found for: HGBA1C

## 2021-03-30 NOTE — Progress Notes (Signed)
Cardiology Office Note  Date: 03/31/2021   ID: [name], DOB 1955/11/15, MRN [id number]  PCP:  Gwenlyn Fudge, FNP  Cardiologist:  Nona Dell, MD Electrophysiologist:  None    Chief complaint 1 year follow-up   History of Present Illness: [name] is a 65 y.o. male last encounter with Dr. Diona Browner on August 23, 2017 for preop clearance exam for right arthroscopic knee surgery.  History of coronary artery disease with bare-metal stent to RCA in 2009.  Cutting Balloon angioplasty for restenosis 2009.  DES to RCA 2009 August.  History of hypertension, inferior wall MI, hyperlipidemia, IBS.  Patient had previous cardiac testing 2016.  Last left ventricular ejection fraction was approximately 45% with perfusion imaging consistent with inferior/inferior lateral scar and mild peri-infarct ischemia.  Patient is here for 1 year follow-up.  Patient denies any hospitalizations doing the interim since last visit.  He states he did test positive for COVID but had no symptoms.  He denies any anginal or exertional symptoms, orthostatic symptoms, CVA or TIA-like symptoms, palpitations or arrhythmias, PND, orthopnea, DOE or SOB.  Denies any bleeding issues.  No claudication-like symptoms, DVT or PE-like symptoms.  Blood pressure is elevated today.  He states at home his blood pressures are within normal limits.  States his blood pressures at home usually run in the 130s over 80s.  He continues to smoke.  He states he smokes about a half a pack per day.  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  He states his last lab work with PCP was reasonable but cholesterol was still a little elevated.   Past Medical History:  Diagnosis Date   CAD (coronary artery disease)    BMS to RCA May 2009, cutting balloon angioplasty for restenosis July 2009, then DES to RCA  August 2009.    History of inferior wall myocardial infarction 11/2007   Hyperlipidemia    IBS (irritable bowel syndrome)    Loose body of right knee 08/2017   Medial meniscus tear 08/2017   right knee   MYOCARDIAL INFARCTION, INFERIOR WALL 11/27/2008   Qualifier: Diagnosis of  By: Genelle Gather CMA, [place]     Wears dentures    upper    Past Surgical History:  Procedure Laterality Date   APPENDECTOMY  07/12/2002   CARDIAC CATHETERIZATION  11/22/2007   stent RCA   CARDIAC CATHETERIZATION  02/21/2008   restenosis RCA - angioplasty   CARDIAC CATHETERIZATION  02/28/2008   drug-eluting stent RCA   CHONDROPLASTY Right 09/13/2017   Procedure: CHONDROPLASTY RIGHT KNEE;  Surgeon: Bjorn Pippin, MD;  Location: [facility];  Service: Orthopedics;  Laterality: Right;   FOOT SURGERY Right    KNEE ARTHROSCOPY Right    KNEE ARTHROSCOPY WITH MEDIAL MENISECTOMY Right 09/13/2017   Procedure: RIGHT KNEE ARTHROSCOPY WITH PARTIAL MEDIAL MENISCECTOMY;  Surgeon: Bjorn Pippin, MD;  Location: [facility];  Service: Orthopedics;  Laterality: Right;    Current Outpatient Medications  Medication Sig Dispense Refill   aspirin EC 81 MG tablet Take 1 tablet (81 mg total) by mouth daily. 90 tablet 3   pravastatin (PRAVACHOL) 40 MG tablet Take 1 tablet (40 mg total) by mouth every evening. 90 tablet 3   No current facility-administered medications for this visit.   Allergies:  Patient has no known allergies.  Social History: The patient  reports that he has been smoking cigarettes. He has never used smokeless tobacco. He reports that he does not drink alcohol and does not use drugs.   Family History: The patient's family history includes Heart attack in his father; Thyroid disease in his sister.   ROS:  Please see the history of present illness. Otherwise, complete review of systems is positive for none.  All other systems are reviewed and negative.   Physical Exam: VS:  BP (!) 175/80   Pulse 60    Ht 5\' 9"  (1.753 m)   Wt 170 lb 3.2 oz (77.2 kg)   SpO2 96%   BMI 25.13 kg/m , BMI Body mass index is 25.13 kg/m.  Wt Readings from Last 3 Encounters:  03/31/21 170 lb 3.2 oz (77.2 kg)  01/06/21 163 lb 12.8 oz (74.3 kg)  11/18/20 161 lb (73 kg)    General: Patient appears comfortable at rest. Neck: Supple, no elevated JVP or carotid bruits, no thyromegaly. Lungs: Clear to auscultation, nonlabored breathing at rest. Cardiac: Regular rate and rhythm, no S3 or significant systolic murmur, no pericardial rub. Extremities: No pitting edema, distal pulses 2+. Skin: Warm and dry. Musculoskeletal: No kyphosis. Neuropsychiatric: Alert and oriented x3, affect grossly appropriate.  ECG: 03/31/2021 sinus rhythm with premature atrial complexes rate of 76, lateral infarct age undetermined, inferior infarct age undetermined  Recent Labwork: 11/18/2020: ALT 8; AST 14; BUN 10; Creatinine, Ser 1.01; Hemoglobin 14.9; Platelets 286; Potassium 4.5; Sodium 141     Component Value Date/Time   CHOL 189 11/18/2020 1157   TRIG 72 11/18/2020 1157   HDL 41 11/18/2020 1157   CHOLHDL 4.6 11/18/2020 1157   CHOLHDL 5.3 11/23/2007 0350   VLDL 17 11/23/2007 0350   LDLCALC 135 (H) 11/18/2020 1157    Other Studies Reviewed Today:   Exercise Myoview 07/12/2015: Abnormal ST segment depression noted in leads V5 and V6 toward peak heart rate in the absence of chest pain. No significant arrhythmias. Large perfusion defect in the inferior and inferolateral wall consistent with scar from previous infarct. There is partial reversibility in the lateral portion of this defect consistent with mild peri-infarct ischemia. This is an intermediate risk study. Nuclear stress EF: 41%.   Echocardiogram 07/12/2015: Study Conclusions   - Left ventricle: The cavity size was normal. Wall thickness was   normal. Systolic function was mildly to moderately reduced. The   estimated ejection fraction was 45%. There is akinesis of  the   inferolateral and inferior myocardium. Doppler parameters are   consistent with abnormal left ventricular relaxation (grade 1   diastolic dysfunction). - Mitral valve: Mildly thickened leaflets . There was trivial   regurgitation. - Right atrium: Central venous pressure (est): 3 mm Hg. - Tricuspid valve: There was trivial regurgitation. - Pulmonary arteries: Systolic pressure could not be accurately   estimated. - Pericardium, extracardiac: There was no pericardial effusion.   Impressions:   - Normal LV wall thickness with LVEF approximately 45%. There is   inferior and inferolateral akinesis consistent with ischemic   cardiomyopathy. Grade 1 diastolic dysfunction. Mildly thickened   mitral leaflets with trivial mitral regurgitation.   Assessment and Plan:  1. Coronary artery disease involving native coronary artery of native heart with angina pectoris (HCC)   2. Dyslipidemia     1. Coronary artery disease involving native coronary artery of native heart with angina pectoris [facility]) Previous history of MI and stent placement RCA in 2009, cutting balloon angioplasty for  restenosis in 2009.  DES to RCA 2009.  Currently denies any anginal or exertional symptoms.  Continue aspirin 81 mg daily.    2. Dyslipidemia History of hyperlipidemia on pravastatin 40 mg p.o. daily.    Patient states he saw his PCP a while back and LDL was still a little elevated.  He admits to some dietary indiscretions.  Most of recent LDL on November 18, 2020 at PCP office was 135.  Previous LDL on 10/17/2019 was 69.  Follow-up with PCP for lipid management.   Medication Adjustments/Labs and Tests Ordered: Current medicines are reviewed at length with the patient today.  Concerns regarding medicines are outlined above.    Patient Instructions  Medication Instructions:  Continue all current medications.  Labwork: none  Testing/Procedures: none  Follow-Up: 1 year   Any Other Special Instructions Will  Be Listed Below (If Applicable).   If you need a refill on your cardiac medications before your next appointment, please call your pharmacy.       Signed, Rennis Harding, NP 03/31/2021 2:37 PM    [facility] at [facility] [address] [place], [place], [place] [address] Phone: [phone]; Fax: [phone]

## 2021-03-31 ENCOUNTER — Ambulatory Visit (INDEPENDENT_AMBULATORY_CARE_PROVIDER_SITE_OTHER): Payer: 59 | Admitting: Family Medicine

## 2021-03-31 ENCOUNTER — Encounter: Payer: Self-pay | Admitting: Family Medicine

## 2021-03-31 VITALS — BP 175/80 | HR 60 | Ht 69.0 in | Wt 170.2 lb

## 2021-03-31 DIAGNOSIS — E785 Hyperlipidemia, unspecified: Secondary | ICD-10-CM | POA: Diagnosis not present

## 2021-03-31 DIAGNOSIS — I25119 Atherosclerotic heart disease of native coronary artery with unspecified angina pectoris: Secondary | ICD-10-CM

## 2021-03-31 NOTE — Patient Instructions (Signed)
Medication Instructions:  Continue all current medications.  Labwork: none  Testing/Procedures: none  Follow-Up: 1 year   Any Other Special Instructions Will Be Listed Below (If Applicable).  If you need a refill on your cardiac medications before your next appointment, please call your pharmacy.  

## 2021-07-01 ENCOUNTER — Ambulatory Visit: Payer: 59 | Admitting: Family Medicine

## 2021-07-01 ENCOUNTER — Encounter: Payer: Self-pay | Admitting: Family Medicine

## 2021-07-01 VITALS — BP 148/90 | HR 106 | Temp 98.7°F | Ht 69.0 in | Wt 170.6 lb

## 2021-07-01 DIAGNOSIS — K5900 Constipation, unspecified: Secondary | ICD-10-CM

## 2021-07-01 NOTE — Progress Notes (Signed)
   Assessment & Plan:  1. Constipation, unspecified constipation type Discussed MiraLAX, milk of magnesia, and Dulcolax suppositories.  Encourage patient to drink more water.  Education provided on constipation.   Follow up plan: Return if symptoms worsen or fail to improve.  [name], MSN, APRN, FNP-C [facility]  Subjective:   Patient ID: [name], male    DOB: 1955-11-17, 65 y.o.   MRN: [id number]  HPI: [name] is a 65 y.o. male presenting on 07/01/2021 for Flank Pain (Right x 4 days) and Constipation (X 5 days)  Patient reports constipation and right upper quadrant pain.  States he has not had a normal bowel movement in the past week.  All of the bowel movements that he has had have been "rabbit balls".  His right upper quadrant pain subsided with a heating pad this week.  He has been caring 38 foot boxes with one of his friends at the end of last week, which he feels caused his abdominal pain.  He did try using MiraLAX x2 days with no results. He admits he does not drink enough water as he mostly drinks mountain dew.   ROS: Negative unless specifically indicated above in HPI.   Relevant past medical history reviewed and updated as indicated.   Allergies and medications reviewed and updated.   Current Outpatient Medications:    aspirin EC 81 MG tablet, Take 1 tablet (81 mg total) by mouth daily., Disp: 90 tablet, Rfl: 3   pravastatin (PRAVACHOL) 40 MG tablet, Take 1 tablet (40 mg total) by mouth every evening., Disp: 90 tablet, Rfl: 3  No Known Allergies  Objective:   BP (!) 148/90   Pulse (!) 106   Temp 98.7 F (37.1 C) (Temporal)   Ht 5\' 9"  (1.753 m)   Wt 170 lb 9.6 oz (77.4 kg)   BMI 25.19 kg/m    Physical Exam Vitals reviewed.  Constitutional:      General: He is not in acute distress.    Appearance: Normal appearance. He is not ill-appearing, toxic-appearing or diaphoretic.  HENT:     Head: Normocephalic and atraumatic.   Eyes:     General: No scleral icterus.       Right eye: No discharge.        Left eye: No discharge.     Conjunctiva/sclera: Conjunctivae normal.  Cardiovascular:     Rate and Rhythm: Normal rate.  Pulmonary:     Effort: Pulmonary effort is normal. No respiratory distress.  Abdominal:     General: Bowel sounds are normal. There is no distension.     Palpations: Abdomen is soft. There is no mass.     Tenderness: There is no abdominal tenderness. There is no guarding or rebound.     Hernia: No hernia is present.  Musculoskeletal:        General: Normal range of motion.     Cervical back: Normal range of motion.  Skin:    General: Skin is warm and dry.  Neurological:     Mental Status: He is alert and oriented to person, place, and time. Mental status is at baseline.  Psychiatric:        Mood and Affect: Mood normal.        Behavior: Behavior normal.        Thought Content: Thought content normal.        Judgment: Judgment normal.

## 2021-07-01 NOTE — Patient Instructions (Signed)
Miralax 1 capful in 6-8 oz beverage of choice once daily as needed.   Milk of Magnesia

## 2021-11-07 ENCOUNTER — Other Ambulatory Visit: Payer: Self-pay | Admitting: Family Medicine

## 2021-11-07 DIAGNOSIS — I25119 Atherosclerotic heart disease of native coronary artery with unspecified angina pectoris: Secondary | ICD-10-CM

## 2021-11-07 DIAGNOSIS — E785 Hyperlipidemia, unspecified: Secondary | ICD-10-CM

## 2021-12-23 ENCOUNTER — Encounter: Payer: Self-pay | Admitting: Family Medicine

## 2021-12-23 ENCOUNTER — Ambulatory Visit: Payer: Self-pay | Admitting: Family Medicine

## 2022-01-19 ENCOUNTER — Encounter: Payer: Self-pay | Admitting: Family Medicine

## 2022-01-19 ENCOUNTER — Ambulatory Visit (INDEPENDENT_AMBULATORY_CARE_PROVIDER_SITE_OTHER): Payer: Medicare HMO | Admitting: Family Medicine

## 2022-01-19 VITALS — BP 128/83 | HR 71 | Temp 97.7°F | Ht 69.0 in | Wt 165.2 lb

## 2022-01-19 DIAGNOSIS — Z0001 Encounter for general adult medical examination with abnormal findings: Secondary | ICD-10-CM | POA: Diagnosis not present

## 2022-01-19 DIAGNOSIS — E785 Hyperlipidemia, unspecified: Secondary | ICD-10-CM

## 2022-01-19 DIAGNOSIS — Z720 Tobacco use: Secondary | ICD-10-CM

## 2022-01-19 DIAGNOSIS — Z0000 Encounter for general adult medical examination without abnormal findings: Secondary | ICD-10-CM

## 2022-01-19 DIAGNOSIS — I25119 Atherosclerotic heart disease of native coronary artery with unspecified angina pectoris: Secondary | ICD-10-CM | POA: Diagnosis not present

## 2022-01-19 DIAGNOSIS — Z125 Encounter for screening for malignant neoplasm of prostate: Secondary | ICD-10-CM | POA: Diagnosis not present

## 2022-01-19 MED ORDER — PRAVASTATIN SODIUM 40 MG PO TABS: 40.0000 mg | ORAL_TABLET | Freq: Every evening | ORAL | 3 refills | Status: DC

## 2022-01-19 MED ORDER — ASPIRIN 81 MG PO TBEC: 81.0000 mg | DELAYED_RELEASE_TABLET | Freq: Every day | ORAL | 3 refills | Status: DC

## 2022-01-19 NOTE — Progress Notes (Signed)
Assessment & Plan:  Well adult exam Discussed health benefits of physical activity, and encouraged him to engage in regular exercise appropriate for his age and condition. Preventive health education provided. Declined HIV screening and COVID booster.  Immunization History  Administered Date(s) Administered   Influenza,inj,Quad PF,6+ Mos 04/17/2019   PFIZER(Purple Top)SARS-COV-2 Vaccination 10/30/2019, 11/21/2019, 05/22/2020   PNEUMOCOCCAL CONJUGATE-20 10/06/2021   Pneumococcal Conjugate-13 12/21/2015   Tdap 12/20/2013, 08/24/2021   Zoster Recombinat (Shingrix) 08/24/2021, 11/12/2021   Health Maintenance  Topic Date Due   COVID-19 Vaccine (4 - Pfizer series) 02/04/2022 (Originally 07/17/2020)   HIV Screening  01/20/2023 (Originally 10/24/1970)   INFLUENZA VACCINE  02/21/2022   COLONOSCOPY (Pts 45-71yr Insurance coverage will need to be confirmed)  01/17/2024   TETANUS/TDAP  08/25/2031   Pneumonia Vaccine 65 Years old  Completed   Hepatitis C Screening  Completed   Zoster Vaccines- Shingrix  Completed   HPV VACCINES  Aged Out   - CBC with Differential/Platelet - CMP14+EGFR - Lipid panel - PSA, total and free  2. Coronary artery disease involving native coronary artery of native heart with angina pectoris (HCC) Continue current regimen. - aspirin EC (EQ ASPIRIN ADULT LOW DOSE) 81 MG tablet; Take 1 tablet (81 mg total) by mouth daily.  Dispense: 90 tablet; Refill: 3 - pravastatin (PRAVACHOL) 40 MG tablet; Take 1 tablet (40 mg total) by mouth every evening.  Dispense: 90 tablet; Refill: 3 - Lipid panel  3. Dyslipidemia Labs to assess. - pravastatin (PRAVACHOL) 40 MG tablet; Take 1 tablet (40 mg total) by mouth every evening.  Dispense: 90 tablet; Refill: 3 - CBC with Differential/Platelet - CMP14+EGFR - Lipid panel  4. Tobacco use 3-5 minute discussion regarding the harms of tobacco use, the benefits of cessation, and methods of cessation. Discussed that there are  medication options to help with cessation. Patient is currently precontemplative.   Follow-up: Return in about 1 year (around 01/20/2023) for annual physical.   BHendricks Limes MSN, APRN, FNP-C [facility]  Subjective:  Patient ID: [name] male    DOB: [birth year] Age: 66y.o. MRN: [id number] Patient Care Team: JLoman Brooklyn FNP as PCP - General (Family Medicine) MSatira Sark MD as PCP - Cardiology (Cardiology)   CC:  Chief Complaint  Patient presents with   Annual Exam    HPI Billy LOUGHMILLERis a 66y.o. male who presents today for a complete physical exam. He reports consuming a general diet. The patient has a physically strenuous job, but has no regular exercise apart from work.  He generally feels well. He reports sleeping fairly well. He does not have additional problems to discuss today.   Vision:Not within last year Dental:Receives regular dental care PLYY:TKPTWSFKcancer screening and PSA options (with potential risks and benefits of testing vs. not testing) were discussed along with recent recs/guidelines.  and Agrees to PSA testing  Lung Cancer Screening with low-dose Chest CT: Declined AAA Screening: Declined  Advanced Directives Patient does not have advanced directives including DNR, living will, healthcare power of attorney, financial power of attorney, and MOST form.  DEPRESSION SCREENING    01/19/2022    8:09 AM 07/01/2021    2:29 PM 01/06/2021   10:05 AM 09/07/2020    4:13 PM 10/17/2019    9:01 AM 04/17/2019    3:16 PM  PHQ 2/9 Scores  PHQ - 2 Score 0 0 0 0 0 2  PHQ- 9 Score 1 1 0  4    Patient ran out of cholesterol medication almost a month ago.   Patient reports he wakes up at 3 AM every morning. This is the time he needed to get up for work everyday for 30 years. He does not want medication to try to help get more sleep, he just wanted me to be aware.   Review of Systems  Constitutional:  Negative for chills, fever,  malaise/fatigue and weight loss.  HENT:  Negative for congestion, ear discharge, ear pain, nosebleeds, sinus pain, sore throat and tinnitus.   Eyes:  Negative for blurred vision, double vision, pain, discharge and redness.  Respiratory:  Negative for cough, shortness of breath and wheezing.   Cardiovascular:  Negative for chest pain, palpitations and leg swelling.  Gastrointestinal:  Negative for abdominal pain, constipation, diarrhea, heartburn, nausea and vomiting.  Genitourinary:  Negative for dysuria, frequency and urgency.  Musculoskeletal:  Negative for myalgias.  Skin:  Negative for rash.  Neurological:  Negative for dizziness, seizures, weakness and headaches.  Psychiatric/Behavioral:  Negative for depression, substance abuse and suicidal ideas. The patient is not nervous/anxious.     Current Outpatient Medications:    aspirin (EQ ASPIRIN ADULT LOW DOSE) 81 MG EC tablet, Take 1 tablet (81 mg total) by mouth daily., Disp: 90 tablet, Rfl: 0   pravastatin (PRAVACHOL) 40 MG tablet, Take 1 tablet (40 mg total) by mouth every evening. (NEEDS TO BE SEEN BEFORE NEXT REFILL), Disp: 30 tablet, Rfl: 0  No Known Allergies  Past Medical History:  Diagnosis Date   CAD (coronary artery disease)    BMS to RCA May 2009, cutting balloon angioplasty for restenosis July 2009, then DES to RCA  August 2009.   History of inferior wall myocardial infarction 11/2007   Hyperlipidemia    IBS (irritable bowel syndrome)    Loose body of right knee 08/2017   Medial meniscus tear 08/2017   right knee   MYOCARDIAL INFARCTION, INFERIOR WALL 11/27/2008   Qualifier: Diagnosis of  By: Danny Lawless CMA, [place]     Wears dentures    upper    Past Surgical History:  Procedure Laterality Date   APPENDECTOMY  07/12/2002   CARDIAC CATHETERIZATION  11/22/2007   stent RCA   CARDIAC CATHETERIZATION  02/21/2008   restenosis RCA - angioplasty   CARDIAC CATHETERIZATION  02/28/2008   drug-eluting stent RCA    CHONDROPLASTY Right 09/13/2017   Procedure: CHONDROPLASTY RIGHT KNEE;  Surgeon: Hiram Gash, MD;  Location: [facility];  Service: Orthopedics;  Laterality: Right;   FOOT SURGERY Right    KNEE ARTHROSCOPY Right    KNEE ARTHROSCOPY WITH MEDIAL MENISECTOMY Right 09/13/2017   Procedure: RIGHT KNEE ARTHROSCOPY WITH PARTIAL MEDIAL MENISCECTOMY;  Surgeon: Hiram Gash, MD;  Location: [facility];  Service: Orthopedics;  Laterality: Right;    Family History  Problem Relation Age of Onset   Heart attack Father        h/o CABG and died of massive heart attackat age 36   Thyroid disease Sister     Social History   Socioeconomic History   Marital status: Widowed    Spouse name: Not on file   Number of children: Not on file   Years of education: Not on file   Highest education level: Not on file  Occupational History   Occupation: Works at a plant   Occupation: [employer], occupational   Occupation: Builds bird houses  Tobacco Use   Smoking status:  Every Day    Years: 30.00    Types: Cigarettes   Smokeless tobacco: Never   Tobacco comments:    1 pack/3 days  Vaping Use   Vaping Use: Never used  Substance and Sexual Activity   Alcohol use: No   Drug use: No   Sexual activity: Not Currently  Other Topics Concern   Not on file  Social History Narrative   Not on file   Social Determinants of Health   Financial Resource Strain: Not on file  Food Insecurity: Not on file  Transportation Needs: Not on file  Physical Activity: Not on file  Stress: Not on file  Social Connections: Not on file  Intimate Partner Violence: Not on file      Objective:    BP 128/83 Comment: home reading from this morning  Pulse 71   Temp 97.7 F (36.5 C) (Temporal)   Ht _0  (1.753 m)   Wt 165 lb 3.2 oz (74.9 kg)   SpO2 97%   BMI 24.40 kg/m     Physical Exam Vitals reviewed.  Constitutional:      General: He is not in acute distress.    Appearance: Normal  appearance. He is normal weight. He is not ill-appearing, toxic-appearing or diaphoretic.  HENT:     Head: Normocephalic and atraumatic.     Right Ear: Tympanic membrane, ear canal and external ear normal. There is no impacted cerumen.     Left Ear: Tympanic membrane, ear canal and external ear normal. There is no impacted cerumen.     Nose: Nose normal. No congestion or rhinorrhea.     Mouth/Throat:     Mouth: Mucous membranes are moist.     Pharynx: Oropharynx is clear. No oropharyngeal exudate or posterior oropharyngeal erythema.  Eyes:     General: No scleral icterus.       Right eye: No discharge.        Left eye: No discharge.     Conjunctiva/sclera: Conjunctivae normal.     Pupils: Pupils are equal, round, and reactive to light.  Neck:     Vascular: No carotid bruit.  Cardiovascular:     Rate and Rhythm: Normal rate and regular rhythm.     Heart sounds: Normal heart sounds. No murmur heard.    No friction rub. No gallop.  Pulmonary:     Effort: Pulmonary effort is normal. No respiratory distress.     Breath sounds: Normal breath sounds. No stridor. No wheezing, rhonchi or rales.  Abdominal:     General: Abdomen is flat. Bowel sounds are normal. There is no distension.     Palpations: Abdomen is soft. There is no hepatomegaly, splenomegaly or mass.     Tenderness: There is no abdominal tenderness. There is no guarding or rebound.     Hernia: No hernia is present.  Musculoskeletal:        General: Normal range of motion.     Cervical back: Normal range of motion and neck supple. No rigidity. No muscular tenderness.     Right lower leg: No edema.     Left lower leg: No edema.  Lymphadenopathy:     Cervical: No cervical adenopathy.  Skin:    General: Skin is warm and dry.     Capillary Refill: Capillary refill takes less than 2 seconds.  Neurological:     General: No focal deficit present.     Mental Status: He is alert and oriented to person, place, and time.  Mental  status is at baseline.  Psychiatric:        Mood and Affect: Mood normal.        Behavior: Behavior normal.        Thought Content: Thought content normal.        Judgment: Judgment normal.    Lab Results  Component Value Date   WBC 10.3 11/18/2020   HGB 14.9 11/18/2020   HCT 44.4 11/18/2020   MCV 88 11/18/2020   PLT 286 11/18/2020   Lab Results  Component Value Date   NA 141 11/18/2020   K 4.5 11/18/2020   CO2 21 11/18/2020   GLUCOSE 95 11/18/2020   BUN 10 11/18/2020   CREATININE 1.01 11/18/2020   BILITOT 0.4 11/18/2020   ALKPHOS 144 (H) 11/18/2020   AST 14 11/18/2020   ALT 8 11/18/2020   PROT 6.8 11/18/2020   ALBUMIN 4.4 11/18/2020   CALCIUM 9.4 11/18/2020   EGFR 83 11/18/2020   Lab Results  Component Value Date   CHOL 189 11/18/2020   Lab Results  Component Value Date   HDL 41 11/18/2020   Lab Results  Component Value Date   LDLCALC 135 (H) 11/18/2020   Lab Results  Component Value Date   TRIG 72 11/18/2020   Lab Results  Component Value Date   CHOLHDL 4.6 11/18/2020   No results found for: "HGBA1C"

## 2022-01-20 ENCOUNTER — Telehealth: Payer: Self-pay | Admitting: Family Medicine

## 2022-01-20 DIAGNOSIS — I25119 Atherosclerotic heart disease of native coronary artery with unspecified angina pectoris: Secondary | ICD-10-CM

## 2022-01-20 DIAGNOSIS — E785 Hyperlipidemia, unspecified: Secondary | ICD-10-CM

## 2022-01-20 LAB — CBC WITH DIFFERENTIAL/PLATELET
Basophils Absolute: 0.1 10*3/uL (ref 0.0–0.2)
Basos: 1 %
EOS (ABSOLUTE): 0.3 10*3/uL (ref 0.0–0.4)
Eos: 3 %
Hematocrit: 44.3 % (ref 37.5–51.0)
Hemoglobin: 15 g/dL (ref 13.0–17.7)
Immature Grans (Abs): 0 10*3/uL (ref 0.0–0.1)
Immature Granulocytes: 0 %
Lymphocytes Absolute: 4.2 10*3/uL — ABNORMAL HIGH (ref 0.7–3.1)
Lymphs: 43 %
MCH: 29.6 pg (ref 26.6–33.0)
MCHC: 33.9 g/dL (ref 31.5–35.7)
MCV: 87 fL (ref 79–97)
Monocytes Absolute: 0.8 10*3/uL (ref 0.1–0.9)
Monocytes: 9 %
Neutrophils Absolute: 4.4 10*3/uL (ref 1.4–7.0)
Neutrophils: 44 %
Platelets: 297 10*3/uL (ref 150–450)
RBC: 5.07 x10E6/uL (ref 4.14–5.80)
RDW: 13.2 % (ref 11.6–15.4)
WBC: 9.8 10*3/uL (ref 3.4–10.8)

## 2022-01-20 LAB — CMP14+EGFR
ALT: 8 IU/L (ref 0–44)
AST: 14 IU/L (ref 0–40)
Albumin/Globulin Ratio: 1.9 (ref 1.2–2.2)
Albumin: 4.1 g/dL (ref 3.8–4.8)
Alkaline Phosphatase: 145 IU/L — ABNORMAL HIGH (ref 44–121)
BUN/Creatinine Ratio: 11 (ref 10–24)
BUN: 10 mg/dL (ref 8–27)
Bilirubin Total: 0.4 mg/dL (ref 0.0–1.2)
CO2: 23 mmol/L (ref 20–29)
Calcium: 9.5 mg/dL (ref 8.6–10.2)
Chloride: 102 mmol/L (ref 96–106)
Creatinine, Ser: 0.94 mg/dL (ref 0.76–1.27)
Globulin, Total: 2.2 g/dL (ref 1.5–4.5)
Glucose: 96 mg/dL (ref 70–99)
Potassium: 4.1 mmol/L (ref 3.5–5.2)
Sodium: 139 mmol/L (ref 134–144)
Total Protein: 6.3 g/dL (ref 6.0–8.5)
eGFR: 90 mL/min/{1.73_m2} (ref >59)

## 2022-01-20 LAB — LIPID PANEL
Chol/HDL Ratio: 4 ratio (ref 0.0–5.0)
Cholesterol, Total: 166 mg/dL (ref 100–199)
HDL: 41 mg/dL (ref >39)
LDL Chol Calc (NIH): 111 mg/dL — ABNORMAL HIGH (ref 0–99)
Triglycerides: 70 mg/dL (ref 0–149)
VLDL Cholesterol Cal: 14 mg/dL (ref 5–40)

## 2022-01-20 LAB — PSA, TOTAL AND FREE
PSA, Free Pct: 23.3 %
PSA, Free: 0.14 ng/mL
Prostate Specific Ag, Serum: 0.6 ng/mL (ref 0.0–4.0)

## 2022-01-20 MED ORDER — ASPIRIN 81 MG PO TBEC: 81.0000 mg | DELAYED_RELEASE_TABLET | Freq: Every day | ORAL | 3 refills | Status: AC

## 2022-01-20 MED ORDER — PRAVASTATIN SODIUM 40 MG PO TABS: 40.0000 mg | ORAL_TABLET | Freq: Every evening | ORAL | 3 refills | Status: DC

## 2022-01-20 NOTE — Telephone Encounter (Signed)
Rx's were sent to wrong pharmacy yesterday.  Needs to be sent to [facility] in [place].

## 2022-01-20 NOTE — Telephone Encounter (Signed)
LM informing patient that RX sent to correct pharmacy

## 2022-01-25 ENCOUNTER — Encounter: Payer: Self-pay | Admitting: Family Medicine

## 2022-01-25 ENCOUNTER — Ambulatory Visit (INDEPENDENT_AMBULATORY_CARE_PROVIDER_SITE_OTHER): Payer: Medicare HMO | Admitting: Family Medicine

## 2022-01-25 DIAGNOSIS — R051 Acute cough: Secondary | ICD-10-CM | POA: Diagnosis not present

## 2022-01-25 MED ORDER — ALBUTEROL SULFATE HFA 108 (90 BASE) MCG/ACT IN AERS: 2.0000 | INHALATION_SPRAY | Freq: Four times a day (QID) | RESPIRATORY_TRACT | 0 refills | Status: DC | PRN

## 2022-01-25 MED ORDER — LORATADINE 10 MG PO TABS: 10.0000 mg | ORAL_TABLET | Freq: Every day | ORAL | 1 refills | Status: DC

## 2022-01-25 MED ORDER — BENZONATATE 200 MG PO CAPS: 200.0000 mg | ORAL_CAPSULE | Freq: Two times a day (BID) | ORAL | 0 refills | Status: DC | PRN
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
## 2022-05-08 ENCOUNTER — Ambulatory Visit: Payer: Self-pay | Admitting: Medical

## 2022-06-11 NOTE — Progress Notes (Unsigned)
Cardiology Office Note:    Date:  06/14/2022   ID:  [name], DOB [birth year], MRN [id number]  PCP:  Gwenlyn Fudge, FNP   [facility] Providers Cardiologist:  Nona Dell, MD     Referring MD: Gwenlyn Fudge, FNP   CC: Here for 1 year follow-up  History of Present Illness:    [name] is a 66 y.o. male with a hx of the following:  CAD, s/p BMS to RCA in 2009, angioplasty with DES to RCA in 2009 HFmrEF, ischemic CM (LVEF 45% 06/2015) Dyslipidemia Irritable bowel syndrome Tobacco Abuse   Previous cardiovascular history of CAD with bare-metal stent placed to RCA in 2009, also underwent Cutting Balloon angioplasty for restenosis in 2009 with drug-eluting stent to RCA in August 2009.  Previous history also includes inferior wall MI, hypertension, hyperlipidemia.   In 2016 he had further cardiac testing.  Nuclear medicine stress test showed abnormal ST segment depression noted in leads V5 and V6 toward peak heart rate in the absence of chest pain, no significant arrhythmias were noted.  There was a large perfusion defect in the inferior and inferolateral wall consistent with scar from previous infarct, there is partial reversibility in the lateral portion of this defect consistent with mild peri-infarct ischemia, this study was noted to be at intermediate risk with nuclear stress EF at 41%.  His echocardiogram also that year revealed mild to moderately reduced systolic function of left ventricle with a EF at 45%, there was akinesis of the inferolateral and inferior myocardium, grade 1 DD, trivial MR, trivial TR.  Last seen by Rennis Harding, NP on March 31, 2021 for 1 year follow-up.  Overall he was doing well denied any chest pain or concerning cardiac complaints or issues.  Blood pressure was elevated at 175/80 in the office, but stated his home BPs were within normal limits.  He continued to smoke, reported smoking about half a pack per day.  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  He states he is doing well. Stays very active as a Engineer, water and plans to retire in the next 1-2 years. Enjoys building bird houses. Denies any chest pain, shortness of breath, palpitations, orthopnea, PND, dizziness, syncope, presyncope, swelling, significant weight changes, acute bleeding, or claudication. Continues to smoke, not interested in quitting or any resources. BP well controlled at home. Denies any other questions or concerns.   Past Medical History:  Diagnosis Date   CAD (coronary artery disease)    BMS to RCA May 2009, cutting balloon angioplasty for restenosis July 2009, then DES to RCA  August 2009.   History of inferior wall myocardial infarction 11/2007   Hyperlipidemia    IBS (irritable bowel syndrome)    Loose body of right knee 08/2017   Medial meniscus tear 08/2017   right knee   MYOCARDIAL INFARCTION, INFERIOR WALL 11/27/2008   Qualifier: Diagnosis of  By: Genelle Gather CMA, [place]     Wears dentures    upper    Past Surgical History:  Procedure Laterality Date   APPENDECTOMY  07/12/2002   CARDIAC CATHETERIZATION  11/22/2007   stent RCA   CARDIAC CATHETERIZATION  02/21/2008   restenosis RCA - angioplasty   CARDIAC CATHETERIZATION  02/28/2008   drug-eluting stent RCA   CHONDROPLASTY Right 09/13/2017   Procedure: CHONDROPLASTY RIGHT KNEE;  Surgeon: Ramond Marrow  T, MD;  Location: [facility];  Service: Orthopedics;  Laterality: Right;   FOOT SURGERY Right    KNEE ARTHROSCOPY Right    KNEE ARTHROSCOPY WITH MEDIAL MENISECTOMY Right 09/13/2017   Procedure: RIGHT KNEE ARTHROSCOPY WITH PARTIAL MEDIAL MENISCECTOMY;  Surgeon: Bjorn PippinVarkey, Dax T, MD;  Location: [facility];  Service: Orthopedics;  Laterality: Right;    Current Medications: Current Meds  Medication Sig   aspirin EC (EQ ASPIRIN ADULT LOW DOSE)  81 MG tablet Take 1 tablet (81 mg total) by mouth daily.   pravastatin (PRAVACHOL) 40 MG tablet Take 1 tablet (40 mg total) by mouth every evening.     Allergies:   Patient has no known allergies.   Social History   Socioeconomic History   Marital status: Widowed    Spouse name: Not on file   Number of children: Not on file   Years of education: Not on file   Highest education level: Not on file  Occupational History   Occupation: Works at a plant   Occupation: [employer]   Occupation: Builds bird houses  Tobacco Use   Smoking status: Every Day    Years: 30.00    Types: Cigarettes    Passive exposure: Never   Smokeless tobacco: Never   Tobacco comments:    1 pack/3 days  Vaping Use   Vaping Use: Never used  Substance and Sexual Activity   Alcohol use: No   Drug use: No   Sexual activity: Not Currently  Other Topics Concern   Not on file  Social History Narrative   Not on file   Social Determinants of Health   Financial Resource Strain: Not on file  Food Insecurity: Not on file  Transportation Needs: Not on file  Physical Activity: Not on file  Stress: Not on file  Social Connections: Not on file     Family History: The patient's family history includes Heart attack in his father; Thyroid disease in his sister.  ROS:   Review of Systems  Constitutional: Negative.   HENT: Negative.    Eyes: Negative.   Respiratory: Negative.    Cardiovascular: Negative.   Gastrointestinal: Negative.   Genitourinary: Negative.   Musculoskeletal: Negative.   Skin: Negative.   Neurological: Negative.   Endo/Heme/Allergies: Negative.   Psychiatric/Behavioral: Negative.      Please see the history of present illness.    All other systems reviewed and are negative.  EKGs/Labs/Other Studies Reviewed:    The following studies were reviewed today:   EKG:  EKG is ordered today.  The ekg ordered today demonstrates NSR, 73bpm, with nonspecific ST/T wave  abnormality, as seen on previous EKG's, with no acute ischemic changes.   2D complete echocardiogram on July 12, 2015: - Left ventricle: The cavity size was normal. Wall thickness was    normal. Systolic function was mildly to moderately reduced. The    estimated ejection fraction was 45%. There is akinesis of the    inferolateral and inferior myocardium. Doppler parameters are    consistent with abnormal left ventricular relaxation (grade 1    diastolic dysfunction).  - Mitral valve: Mildly thickened leaflets . There was trivial    regurgitation.  - Right atrium: Central venous pressure (est): 3 mm Hg.  - Tricuspid valve: There was trivial regurgitation.  - Pulmonary arteries: Systolic pressure could not be accurately    estimated.  - Pericardium, extracardiac: There was no pericardial effusion.  Myoview on July 12, 2015: Abnormal ST segment depression noted in leads V5 and V6 toward peak heart rate in the absence of chest pain. No significant arrhythmias. Large perfusion defect in the inferior and inferolateral wall consistent with scar from previous infarct. There is partial reversibility in the lateral portion of this defect consistent with mild peri-infarct ischemia. This is an intermediate risk study. Nuclear stress EF: 41%.  Recent Labs: 01/19/2022: ALT 8; BUN 10; Creatinine, Ser 0.94; Hemoglobin 15.0; Platelets 297; Potassium 4.1; Sodium 139  Recent Lipid Panel    Component Value Date/Time   CHOL 166 01/19/2022 0847   TRIG 70 01/19/2022 0847   HDL 41 01/19/2022 0847   CHOLHDL 4.0 01/19/2022 0847   CHOLHDL 5.3 11/23/2007 0350   VLDL 17 11/23/2007 0350   LDLCALC 111 (H) 01/19/2022 0847    Physical Exam:    VS:  BP (!) 145/82 (BP Location: Left Arm, Patient Position: Sitting, Cuff Size: Normal)   Pulse 82   Ht 5\' 9"  (1.753 m)   Wt 164 lb 12.8 oz (74.8 kg)   SpO2 97%   BMI 24.34 kg/m     Wt Readings from Last 3 Encounters:  06/13/22 164 lb 12.8 oz (74.8 kg)   01/19/22 165 lb 3.2 oz (74.9 kg)  07/01/21 170 lb 9.6 oz (77.4 kg)    Vitals:   06/13/22 1257 06/13/22 1300  BP: (!) 150/88 (!) 145/82  Pulse: 82   SpO2: 97%      GEN: Well nourished, well developed in no acute distress HEENT: Normal NECK: No JVD; No carotid bruits CARDIAC: S1/S2, RRR, no murmurs, rubs, gallops; 2+ peripheral pulses throughout, strong and equal bilaterally RESPIRATORY:  Clear to auscultation without rales, wheezing or rhonchi  MUSCULOSKELETAL:  No edema; No deformity  SKIN: Warm and dry NEUROLOGIC:  Alert and oriented x 3 PSYCHIATRIC:  Normal affect   ASSESSMENT:    1. Coronary artery disease involving native coronary artery of native heart without angina pectoris   2. Heart failure with mildly reduced ejection fraction (HFmrEF) (HCC)   3. Ischemic cardiomyopathy   4. Hyperlipidemia, unspecified hyperlipidemia type   5. Medication management   6. Hypertension, unspecified type   7. Tobacco abuse    PLAN:    In order of problems listed above:  CAD S/P BMS to RCA in 2009, angioplasty with DES to RCA in 2009. Stable with no anginal symptoms. No indication for ischemic evaluation. Continue ASA and Pravastatin. Initiate Zetia to further lower LDL and low dose Toprol-XL for CAD and hypertension management as discussed below. Heart healthy diet and regular cardiovascular exercise encouraged.   HFmrEF, ischemic CM TTE in 2016 revealed EF 45%, akinesis of inferolateral and inferior myocardium, due to hx of inferior wall MI in 2009. Will initiate low dose Toprol-XL. Continue current medication regimen. Euvolemic and well compensated on exam. Low sodium diet, fluid restriction <2L, and daily weights encouraged. Educated to contact our office for weight gain of 2 lbs overnight or 5 lbs in one week. Heart healthy diet and regular cardiovascular exercise encouraged. Plan to update TTE in the future if he develops any symptoms or when clinically indicated.   HLD,  medication management Labs from June 2023 revealed total cholesterol 166, HDL 41, LDL 111, and triglycerides 70.  Currently tolerating pravastatin well and will initiate Zetia 10 mg daily for further LDL lowering.  Will obtain FLP and LFT in 2 months.  Heart healthy diet and regular cardiovascular exercise encouraged.   HTN Initial blood pressure on  arrival 150/88, repeat BP 145/82.  Discussed SBP goal is less than 130.  Will initiate low-dose Toprol-XL as mentioned above. Discussed to monitor BP at home at least 2 hours after medications and sitting for 5-10 minutes.  Blood pressure log and Salty Six sheet given today. Heart healthy diet and regular cardiovascular exercise encouraged.   Tobacco abuse Continues to smoke.  Encourage smoking cessation and offered resources, however patient politely declined.  6.  Disposition: Follow-up with me or Dr. Diona Browner in 2 months or sooner if anything changes  Medication Adjustments/Labs and Tests Ordered: Current medicines are reviewed at length with the patient today.  Concerns regarding medicines are outlined above.  Orders Placed This Encounter  Procedures   Lipid panel   Hepatic function panel   EKG 12-Lead   Meds ordered this encounter  Medications   ezetimibe (ZETIA) 10 MG tablet    Sig: Take 1 tablet (10 mg total) by mouth daily.    Dispense:  90 tablet    Refill:  1    06/13/22 [facility]   metoprolol succinate (TOPROL-XL) 50 MG 24 hr tablet    Sig: Take 0.5 tablets (25 mg total) by mouth daily. Take with or immediately following a meal.    Dispense:  45 tablet    Refill:  1    06/13/22 New Start    Patient Instructions  Medication Instructions:  Your physician has recommended you make the following change in your medication:  Start zetia 10 mg once a day Start metoprolol succinate 25 mg once a day (half of a 50 mg tablet) Continue all other medications as directed  Labwork: FLP, LFT (due 1-2 weeks before next scheduled  appointment) Labs to be done at [facility]  Testing/Procedures: none  Follow-Up:  Your physician recommends that you schedule a follow-up appointment in: 2 months  Any Other Special Instructions Will Be Listed Below (If Applicable).  If you need a refill on your cardiac medications before your next appointment, please call your pharmacy.    Signed, Sharlene Dory, NP  06/14/2022 1:11 PM    [facility]

## 2022-06-13 ENCOUNTER — Ambulatory Visit: Payer: Medicare HMO | Attending: Nurse Practitioner | Admitting: Nurse Practitioner

## 2022-06-13 ENCOUNTER — Encounter: Payer: Self-pay | Admitting: Nurse Practitioner

## 2022-06-13 VITALS — BP 145/82 | HR 82 | Ht 69.0 in | Wt 164.8 lb

## 2022-06-13 DIAGNOSIS — E785 Hyperlipidemia, unspecified: Secondary | ICD-10-CM | POA: Diagnosis not present

## 2022-06-13 DIAGNOSIS — Z79899 Other long term (current) drug therapy: Secondary | ICD-10-CM | POA: Diagnosis not present

## 2022-06-13 DIAGNOSIS — I25119 Atherosclerotic heart disease of native coronary artery with unspecified angina pectoris: Secondary | ICD-10-CM

## 2022-06-13 DIAGNOSIS — Z720 Tobacco use: Secondary | ICD-10-CM | POA: Diagnosis not present

## 2022-06-13 DIAGNOSIS — I255 Ischemic cardiomyopathy: Secondary | ICD-10-CM | POA: Diagnosis not present

## 2022-06-13 DIAGNOSIS — I5022 Chronic systolic (congestive) heart failure: Secondary | ICD-10-CM | POA: Diagnosis not present

## 2022-06-13 DIAGNOSIS — I10 Essential (primary) hypertension: Secondary | ICD-10-CM

## 2022-06-13 DIAGNOSIS — I2510 Atherosclerotic heart disease of native coronary artery without angina pectoris: Secondary | ICD-10-CM

## 2022-06-13 MED ORDER — EZETIMIBE 10 MG PO TABS: 10.0000 mg | ORAL_TABLET | Freq: Every day | ORAL | 1 refills | Status: DC

## 2022-06-13 MED ORDER — METOPROLOL SUCCINATE ER 50 MG PO TB24: 25.0000 mg | ORAL_TABLET | Freq: Every day | ORAL | 1 refills | Status: DC

## 2022-06-13 NOTE — Patient Instructions (Signed)
Medication Instructions:  Your physician has recommended you make the following change in your medication:  Start zetia 10 mg once a day Start metoprolol succinate 25 mg once a day (half of a 50 mg tablet) Continue all other medications as directed  Labwork: FLP, LFT (due 1-2 weeks before next scheduled appointment) Labs to be done at [facility]  Testing/Procedures: none  Follow-Up:  Your physician recommends that you schedule a follow-up appointment in: 2 months  Any Other Special Instructions Will Be Listed Below (If Applicable).  If you need a refill on your cardiac medications before your next appointment, please call your pharmacy.

## 2022-06-14 ENCOUNTER — Encounter: Payer: Self-pay | Admitting: Nurse Practitioner

## 2022-06-27 ENCOUNTER — Ambulatory Visit (INDEPENDENT_AMBULATORY_CARE_PROVIDER_SITE_OTHER): Payer: Medicare HMO | Admitting: Family Medicine

## 2022-06-27 DIAGNOSIS — J3089 Other allergic rhinitis: Secondary | ICD-10-CM | POA: Diagnosis not present

## 2022-06-27 MED ORDER — LEVOCETIRIZINE DIHYDROCHLORIDE 5 MG PO TABS: 5.0000 mg | ORAL_TABLET | Freq: Every evening | ORAL | 99 refills | Status: DC | PRN

## 2022-06-27 NOTE — Progress Notes (Signed)
Telephone visit  Subjective: CC: URI PCP: Gwenlyn Fudge, FNP [name] is a 66 y.o. male calls for telephone consult today. Patient provides verbal consent for consult held via phone.  Due to COVID-19 pandemic this visit was conducted virtually. This visit type was conducted due to national recommendations for restrictions regarding the COVID-19 Pandemic (e.g. social distancing, sheltering in place) in an effort to limit this patient's exposure and mitigate transmission in our community. All issues noted in this document were discussed and addressed.  A physical exam was not performed with this format.   Location of patient: home Location of provider: [facility] Others present for call: none  1. URI Patient reports that he started having rhinorrhea and nasal congestion over the last few days.  Taking nyquil and it helps a little.  No fevers, cough, body aches, nausea, vomiting or diarrhea.  No use of nasal sprays.  Took some benadryl without much relief.   ROS: Per HPI  No Known Allergies Past Medical History:  Diagnosis Date   CAD (coronary artery disease)    BMS to RCA May 2009, cutting balloon angioplasty for restenosis July 2009, then DES to RCA  August 2009.   History of inferior wall myocardial infarction 11/2007   Hyperlipidemia    IBS (irritable bowel syndrome)    Loose body of right knee 08/2017   Medial meniscus tear 08/2017   right knee   MYOCARDIAL INFARCTION, INFERIOR WALL 11/27/2008   Qualifier: Diagnosis of  By: Genelle Gather CMA, [place]     Wears dentures    upper    Current Outpatient Medications:    albuterol (VENTOLIN HFA) 108 (90 Base) MCG/ACT inhaler, Inhale 2 puffs into the lungs every 6 (six) hours as needed for wheezing or shortness of breath. (Patient not taking: Reported on 06/13/2022), Disp: 8 g, Rfl: 0   aspirin EC (EQ ASPIRIN ADULT LOW DOSE) 81 MG tablet, Take 1 tablet (81 mg total) by mouth daily., Disp: 90 tablet, Rfl: 3   benzonatate (TESSALON) 200  MG capsule, Take 1 capsule (200 mg total) by mouth 2 (two) times daily as needed for cough. (Patient not taking: Reported on 06/13/2022), Disp: 30 capsule, Rfl: 0   ezetimibe (ZETIA) 10 MG tablet, Take 1 tablet (10 mg total) by mouth daily., Disp: 90 tablet, Rfl: 1   loratadine (CLARITIN) 10 MG tablet, Take 1 tablet (10 mg total) by mouth daily. (Patient not taking: Reported on 06/13/2022), Disp: 30 tablet, Rfl: 1   metoprolol succinate (TOPROL-XL) 50 MG 24 hr tablet, Take 0.5 tablets (25 mg total) by mouth daily. Take with or immediately following a meal., Disp: 45 tablet, Rfl: 1   pravastatin (PRAVACHOL) 40 MG tablet, Take 1 tablet (40 mg total) by mouth every evening., Disp: 90 tablet, Rfl: 3  Assessment/ Plan: 66 y.o. male   Non-seasonal allergic rhinitis, unspecified trigger - Plan: levocetirizine (XYZAL) 5 MG tablet  Suspect allergic rhinitis.  Xyzal sent.  Home care instructions reviewed and reasons for reevaluation including signs and symptoms of bacterial infection discussed.  At this time I am not convinced that its infectious but it certainly could be early viral infection.  He will contact us if needed.  Start time: 11:45am End time: 11:51a  Total time spent on patient care (including telephone call/ virtual visit): 6 minutes  Tatisha Cerino Hulen Skains, DO [facility] [phone]

## 2022-07-21 ENCOUNTER — Telehealth (INDEPENDENT_AMBULATORY_CARE_PROVIDER_SITE_OTHER): Payer: Medicare HMO | Admitting: Family Medicine

## 2022-07-21 ENCOUNTER — Encounter: Payer: Self-pay | Admitting: Family Medicine

## 2022-07-21 DIAGNOSIS — J988 Other specified respiratory disorders: Secondary | ICD-10-CM | POA: Diagnosis not present

## 2022-07-21 MED ORDER — PREDNISONE 20 MG PO TABS: 40.0000 mg | ORAL_TABLET | Freq: Every day | ORAL | 0 refills | Status: AC

## 2022-07-21 MED ORDER — DOXYCYCLINE HYCLATE 100 MG PO TABS: 100.0000 mg | ORAL_TABLET | Freq: Two times a day (BID) | ORAL | 0 refills | Status: DC

## 2022-07-21 NOTE — Progress Notes (Signed)
Virtual Visit  Note Due to COVID-19 pandemic this visit was conducted virtually. This visit type was conducted due to national recommendations for restrictions regarding the COVID-19 Pandemic (e.g. social distancing, sheltering in place) in an effort to limit this patient's exposure and mitigate transmission in our community. All issues noted in this document were discussed and addressed.  A physical exam was not performed with this format.  I connected with [name] on 07/21/22 at 1334 by telephone and verified that I am speaking with the correct person using two identifiers. [name] is currently located at home and no one is currently with him during the visit. The provider, Gabriel Earing, FNP is located in their office at time of visit.  I discussed the limitations, risks, security and privacy concerns of performing an evaluation and management service by telephone and the availability of in person appointments. I also discussed with the patient that there may be a patient responsible charge related to this service. The patient expressed understanding and agreed to proceed.  Unable to connect via video. Telephone audio only.   History and Present Illness:  Cough This is a new problem. The current episode started 1 to 4 weeks ago. The problem has been unchanged. The cough is Productive of sputum (white). Pertinent negatives include no chest pain, chills, ear congestion, ear pain, fever, headaches, nasal congestion, postnasal drip, sore throat, shortness of breath or wheezing. Risk factors for lung disease include smoking/tobacco exposure. He has tried OTC cough suppressant for the symptoms. His past medical history is significant for bronchitis. There is no history of asthma, bronchiectasis, COPD, emphysema, environmental allergies or pneumonia.      Review of Systems  Constitutional:  Negative for chills and fever.  HENT:  Negative for ear pain, postnasal drip and sore throat.    Respiratory:  Positive for cough. Negative for shortness of breath and wheezing.   Cardiovascular:  Negative for chest pain.  Neurological:  Negative for headaches.  Endo/Heme/Allergies:  Negative for environmental allergies.     Observations/Objective: Alert and oriented x 3. Able to speak in full sentences without difficulty.    Assessment and Plan: Diagnoses and all orders for this visit:  Respiratory infection Will treat with abx given tobacco use and length of symptoms. Prednisone burst as below. Discussed symptomatic care and return precautions.  -     doxycycline (VIBRA-TABS) 100 MG tablet; Take 1 tablet (100 mg total) by mouth 2 (two) times daily for 7 days. -     predniSONE (DELTASONE) 20 MG tablet; Take 2 tablets (40 mg total) by mouth daily for 5 days.    Follow Up Instructions: As needed.     I discussed the assessment and treatment plan with the patient. The patient was provided an opportunity to ask questions and all were answered. The patient agreed with the plan and demonstrated an understanding of the instructions.   The patient was advised to call back or seek an in-person evaluation if the symptoms worsen or if the condition fails to improve as anticipated.  The above assessment and management plan was discussed with the patient. The patient verbalized understanding of and has agreed to the management plan. Patient is aware to call the clinic if symptoms persist or worsen. Patient is aware when to return to the clinic for a follow-up visit. Patient educated on when it is appropriate to go to the emergency department.   Time call ended: 1346   I provided 12  minutes of  non face-to-face time during this encounter.    Gwenlyn Perking, FNP

## 2022-07-28 ENCOUNTER — Encounter: Payer: Self-pay | Admitting: Family Medicine

## 2022-07-28 ENCOUNTER — Ambulatory Visit (INDEPENDENT_AMBULATORY_CARE_PROVIDER_SITE_OTHER): Payer: Medicare HMO | Admitting: Family Medicine

## 2022-07-28 VITALS — BP 136/68 | HR 83 | Temp 98.2°F | Ht 69.0 in | Wt 167.0 lb

## 2022-07-28 DIAGNOSIS — R052 Subacute cough: Secondary | ICD-10-CM | POA: Diagnosis not present

## 2022-07-28 DIAGNOSIS — E785 Hyperlipidemia, unspecified: Secondary | ICD-10-CM | POA: Diagnosis not present

## 2022-07-28 DIAGNOSIS — Z79899 Other long term (current) drug therapy: Secondary | ICD-10-CM | POA: Diagnosis not present

## 2022-07-28 MED ORDER — ALBUTEROL SULFATE HFA 108 (90 BASE) MCG/ACT IN AERS: 2.0000 | INHALATION_SPRAY | Freq: Four times a day (QID) | RESPIRATORY_TRACT | 0 refills | Status: DC | PRN

## 2022-07-28 NOTE — Patient Instructions (Signed)

## 2022-07-28 NOTE — Progress Notes (Signed)
Acute Office Visit  Subjective:     Patient ID: [name], male    DOB: Oct 08, 1955, 67 y.o.   MRN: [id number]  Chief Complaint  Patient presents with   Cough    Cough This is a new problem. Episode onset: 2 weeks. The problem has been unchanged. The cough is Productive of sputum (white). Associated symptoms include postnasal drip. Pertinent negatives include no chest pain, chills, ear congestion, ear pain, fever, headaches, heartburn, hemoptysis, myalgias, nasal congestion, rash, rhinorrhea, sore throat, shortness of breath, sweats, weight loss or wheezing. 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 He has tried oral steroids and OTC cough suppressant (doxycyline) for the symptoms. The treatment provided mild relief.   He reports that xyzal previously resolved his cough. He is no longer taking this, but has more at home. He also is out of his albuterol inhaler and would like a refill.   Review of Systems  Constitutional:  Negative for chills, fever and weight loss.  HENT:  Positive for postnasal drip. Negative for ear pain, rhinorrhea and sore throat.   Respiratory:  Positive for cough. Negative for hemoptysis, shortness of breath and wheezing.   Cardiovascular:  Negative for chest pain.  Gastrointestinal:  Negative for heartburn.  Musculoskeletal:  Negative for myalgias.  Skin:  Negative for rash.  Neurological:  Negative for headaches.        Objective:    BP 136/68   Pulse 83   Temp 98.2 F (36.8 C) (Oral)   Ht 5\' 9"  (1.753 m)   Wt 167 lb (75.8 kg)   SpO2 97%   BMI 24.66 kg/m    Physical Exam Vitals and nursing note reviewed.  Constitutional:      General: He is not in acute distress.    Appearance: He is not ill-appearing, toxic-appearing or diaphoretic.  HENT:     Nose: No congestion.     Mouth/Throat:     Mouth: Mucous membranes are moist.     Pharynx: Oropharynx is clear. No oropharyngeal  exudate or posterior oropharyngeal erythema.  Cardiovascular:     Rate and Rhythm: Normal rate and regular rhythm.     Heart sounds: Normal heart sounds. No murmur heard. Pulmonary:     Effort: No respiratory distress.     Breath sounds: Normal breath sounds. No wheezing, rhonchi or rales.  Chest:     Chest wall: No tenderness.  Musculoskeletal:     Cervical back: Neck supple. No rigidity.  Skin:    General: Skin is warm and dry.  Neurological:     General: No focal deficit present.     Mental Status: He is alert and oriented to person, place, and time.  Psychiatric:        Mood and Affect: Mood normal.        Behavior: Behavior normal.        Thought Content: Thought content normal.        Judgment: Judgment normal.     No results found for any visits on 07/28/22.      Assessment & Plan:   Travonne was seen today for cough.  Diagnoses and all orders for this visit:  Subacute cough Restart xyzal. Discussed cough can linger for weeks after URI. Refill of albuterol provided. Return to office for new or worsening symptoms, or if symptoms persist.  -     albuterol (VENTOLIN HFA) 108 (90 Base) MCG/ACT inhaler; Inhale 2 puffs into the  lungs every 6 (six) hours as needed for wheezing or shortness of breath.  The patient indicates understanding of these issues and agrees with the plan.  Gwenlyn Perking, FNP

## 2022-07-29 LAB — LIPID PANEL
Chol/HDL Ratio: 2.5 ratio (ref 0.0–5.0)
Cholesterol, Total: 119 mg/dL (ref 100–199)
HDL: 48 mg/dL (ref >39)
LDL Chol Calc (NIH): 56 mg/dL (ref 0–99)
Triglycerides: 74 mg/dL (ref 0–149)
VLDL Cholesterol Cal: 15 mg/dL (ref 5–40)

## 2022-07-29 LAB — HEPATIC FUNCTION PANEL
ALT: 18 IU/L (ref 0–44)
AST: 14 IU/L (ref 0–40)
Albumin: 3.9 g/dL (ref 3.9–4.9)
Alkaline Phosphatase: 112 IU/L (ref 44–121)
Bilirubin Total: 0.2 mg/dL (ref 0.0–1.2)
Bilirubin, Direct: <0.1 mg/dL (ref 0.00–0.40)
Total Protein: 6.5 g/dL (ref 6.0–8.5)

## 2022-08-01 ENCOUNTER — Telehealth: Payer: Self-pay | Admitting: Cardiology

## 2022-08-01 NOTE — Telephone Encounter (Signed)
Patient was returning call. Please advise ?

## 2022-08-01 NOTE — Telephone Encounter (Signed)
Finis Bud, NP   Lab results are excellent! Will review these at next OV later this month.  Thanks!  Patient notified,copied pcp

## 2022-08-14 ENCOUNTER — Encounter: Payer: Self-pay | Admitting: Nurse Practitioner

## 2022-08-14 ENCOUNTER — Ambulatory Visit: Payer: Medicare HMO | Attending: Nurse Practitioner | Admitting: Nurse Practitioner

## 2022-08-14 VITALS — BP 143/77 | HR 76 | Ht 69.0 in | Wt 167.6 lb

## 2022-08-14 DIAGNOSIS — I10 Essential (primary) hypertension: Secondary | ICD-10-CM

## 2022-08-14 DIAGNOSIS — Z720 Tobacco use: Secondary | ICD-10-CM | POA: Diagnosis not present

## 2022-08-14 DIAGNOSIS — E785 Hyperlipidemia, unspecified: Secondary | ICD-10-CM

## 2022-08-14 DIAGNOSIS — Z79899 Other long term (current) drug therapy: Secondary | ICD-10-CM

## 2022-08-14 DIAGNOSIS — I255 Ischemic cardiomyopathy: Secondary | ICD-10-CM | POA: Diagnosis not present

## 2022-08-14 DIAGNOSIS — I5022 Chronic systolic (congestive) heart failure: Secondary | ICD-10-CM

## 2022-08-14 DIAGNOSIS — J069 Acute upper respiratory infection, unspecified: Secondary | ICD-10-CM | POA: Diagnosis not present

## 2022-08-14 DIAGNOSIS — I2510 Atherosclerotic heart disease of native coronary artery without angina pectoris: Secondary | ICD-10-CM | POA: Diagnosis not present

## 2022-08-14 DIAGNOSIS — R051 Acute cough: Secondary | ICD-10-CM | POA: Diagnosis not present

## 2022-08-14 MED ORDER — EZETIMIBE 10 MG PO TABS: 10.0000 mg | ORAL_TABLET | Freq: Every day | ORAL | 3 refills | Status: DC

## 2022-08-14 MED ORDER — BENZONATATE 100 MG PO CAPS: 100.0000 mg | ORAL_CAPSULE | Freq: Three times a day (TID) | ORAL | 0 refills | Status: DC | PRN

## 2022-08-14 MED ORDER — METOPROLOL SUCCINATE ER 50 MG PO TB24: 50.0000 mg | ORAL_TABLET | Freq: Every day | ORAL | 3 refills | Status: DC

## 2022-08-14 NOTE — Progress Notes (Signed)
Cardiology Office Note:    Date:  08/14/2022   ID:  [name], DOB 1956-06-22, MRN [id number]  PCP:  [name], [place] [facility] Cardiologist:  Billy Lesches, MD     Referring MD: Billy Brooklyn, FNP   CC: Here for follow-up  History of Present Illness:    [name] is a 67 y.o. male with a hx of the following:  CAD, s/p BMS to RCA in 2009, angioplasty with DES to RCA in 2009 HFmrEF, ischemic CM (LVEF 45% 06/2015) Dyslipidemia Irritable bowel syndrome Tobacco Abuse   Previous cardiovascular history of CAD with bare-metal stent placed to RCA in 2009, also underwent Cutting Balloon angioplasty for restenosis in 2009 with drug-eluting stent to RCA in August 2009.  Previous history also includes inferior wall MI, hypertension, hyperlipidemia.   In 2016 he had further cardiac testing.  Nuclear medicine stress test showed abnormal ST segment depression noted in leads V5 and V6 toward peak heart rate in the absence of chest pain, no significant arrhythmias were noted.  There was a large perfusion defect in the inferior and inferolateral wall consistent with scar from previous infarct, there is partial reversibility in the lateral portion of this defect consistent with mild peri-infarct ischemia, this study was noted to be at intermediate risk with nuclear stress EF at 41%.  His echocardiogram also that year revealed mild to moderately reduced systolic function of left ventricle with a EF at 45%, there was akinesis of the inferolateral and inferior myocardium, grade 1 DD, trivial MR, trivial TR.  Seen by Levell July, NP on March 31, 2021 for 1 year follow-up.  Overall he was doing well denied any chest pain or concerning cardiac complaints or issues.  Blood pressure was elevated at 175/80 in the office, but stated his home BPs were within normal limits.  He continued to smoke, reported smoking about half a pack per day.  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 25 years  I saw him for overdue 1 year follow-up on 06/13/2022.  Was doing well. Was staying very active as a Social research officer, government, planned to retire in the next 1-2 years.  Denied any chest pain, shortness of breath, palpitations, orthopnea, PND, dizziness, syncope, presyncope, swelling, significant weight changes, acute bleeding, or claudication. Continued to smoke, was not interested in quitting or any resources. Started on Toprol XL 12.5 mg daily as well as Zetia. Told to follow-up in 2 months.   Today he presents for follow-up.  Does state he has had recent URI symptoms.  Recently completed prednisone and antibiotics.  Was also prescribed inhaler as needed.  Denies any chest pain, but does admit to chest cold symptoms.  Says when he takes a hot shower it helps relieve his symptoms.  Compliant with his medications and tolerating well. Denies any shortness of breath, palpitations, syncope, presyncope, dizziness, orthopnea, PND, swelling or significant weight changes, acute bleeding, or claudication.  SH:  Enjoys building bird houses.  Works as a Social research officer, government.  Past Medical History:  Diagnosis Date   CAD (coronary artery disease)    BMS to RCA May 2009, cutting balloon angioplasty for restenosis July 2009, then DES to RCA  August 2009.   History of inferior wall myocardial infarction 11/2007   Hyperlipidemia    IBS (irritable bowel syndrome)    Loose body of right knee 08/2017   Medial meniscus tear 08/2017  right knee   MYOCARDIAL INFARCTION, INFERIOR WALL 11/27/2008   Qualifier: Diagnosis of  By: Genelle Gather CMA, [place]     Wears dentures    upper    Past Surgical History:  Procedure Laterality Date   APPENDECTOMY  07/12/2002   CARDIAC CATHETERIZATION  11/22/2007   stent RCA   CARDIAC CATHETERIZATION  02/21/2008   restenosis RCA - angioplasty   CARDIAC CATHETERIZATION  02/28/2008   drug-eluting stent RCA    CHONDROPLASTY Right 09/13/2017   Procedure: CHONDROPLASTY RIGHT KNEE;  Surgeon: Bjorn Pippin, MD;  Location: [facility];  Service: Orthopedics;  Laterality: Right;   FOOT SURGERY Right    KNEE ARTHROSCOPY Right    KNEE ARTHROSCOPY WITH MEDIAL MENISECTOMY Right 09/13/2017   Procedure: RIGHT KNEE ARTHROSCOPY WITH PARTIAL MEDIAL MENISCECTOMY;  Surgeon: Bjorn Pippin, MD;  Location: [facility];  Service: Orthopedics;  Laterality: Right;    Current Medications: Current Meds  Medication Sig   Albuterol (Ventolin HFA) 108 (90 base) MCG/ACT inhaler Inhale 2 puffs into the lungs every 6 (six) hours as needed for wheezing or shortness of breath.   aspirin EC (EQ ASPIRIN ADULT LOW DOSE) 81 MG tablet Take 1 tablet (81 mg total) by mouth daily.   Ezetimibe (Zetia) 10 mg tablet Take 1 tablet (10 mg total) by mouth daily.   Levocetirizine (Xyzal) 5 mg tablet Take 1 tablet (5 mg total) by mouth at bedtime as needed for allergies (runny nose).    pravastatin (PRAVACHOL) 40 MG tablet Take 1 tablet (40 mg total) by mouth every evening.     Allergies:   Patient has no known allergies.   Social History   Socioeconomic History   Marital status: Widowed    Spouse name: Not on file   Number of children: Not on file   Years of education: Not on file   Highest education level: Not on file  Occupational History   Occupation: Works at a plant   Occupation: [employer]   Occupation: Builds bird houses  Tobacco Use   Smoking status: Every Day    Years: 30.00    Types: Cigarettes    Passive exposure: Never   Smokeless tobacco: Never   Tobacco comments:    1 pack/3 days  Vaping Use   Vaping Use: Never used  Substance and Sexual Activity   Alcohol use: No   Drug use: No   Sexual activity: Not Currently  Other Topics Concern   Not on file  Social History Narrative   Not on file   Social Determinants of Health   Financial Resource Strain: Not on file  Food  Insecurity: Not on file  Transportation Needs: Not on file  Physical Activity: Not on file  Stress: Not on file  Social Connections: Not on file     Family History: The patient's family history includes Heart attack in his father; Thyroid disease in his sister.  ROS:   Review of Systems  Constitutional: Negative.   HENT:  Positive for congestion, ear pain, hearing loss and sinus pain. Negative for ear discharge, nosebleeds, sore throat and tinnitus.        Recent URI.  See HPI.  Eyes: Negative.   Respiratory:  Positive for cough and sputum production. Negative for hemoptysis, shortness of breath, wheezing and stridor.        Recent URI.  See HPI.  Clear sputum.  Cardiovascular: Negative.   Gastrointestinal: Negative.   Genitourinary: Negative.  Musculoskeletal: Negative.   Skin: Negative.   Neurological: Negative.   Endo/Heme/Allergies: Negative.   Psychiatric/Behavioral: Negative.       Please see the history of present illness.    All other systems reviewed and are negative.  EKGs/Labs/Other Studies Reviewed:    The following studies were reviewed today:   EKG:  EKG is not ordered today.   2D complete echocardiogram on July 12, 2015: - Left ventricle: The cavity size was normal. Wall thickness was    normal. Systolic function was mildly to moderately reduced. The    estimated ejection fraction was 45%. There is akinesis of the    inferolateral and inferior myocardium. Doppler parameters are    consistent with abnormal left ventricular relaxation (grade 1    diastolic dysfunction).  - Mitral valve: Mildly thickened leaflets . There was trivial    regurgitation.  - Right atrium: Central venous pressure (est): 3 mm Hg.  - Tricuspid valve: There was trivial regurgitation.  - Pulmonary arteries: Systolic pressure could not be accurately    estimated.  - Pericardium, extracardiac: There was no pericardial effusion.  Myoview on July 12, 2015: Abnormal ST  segment depression noted in leads V5 and V6 toward peak heart rate in the absence of chest pain. No significant arrhythmias. Large perfusion defect in the inferior and inferolateral wall consistent with scar from previous infarct. There is partial reversibility in the lateral portion of this defect consistent with mild peri-infarct ischemia. This is an intermediate risk study. Nuclear stress EF: 41%.  Recent Labs: 01/19/2022: BUN 10; Creatinine, Ser 0.94; Hemoglobin 15.0; Platelets 297; Potassium 4.1; Sodium 139 07/28/2022: ALT 18  Recent Lipid Panel    Component Value Date/Time   CHOL 119 07/28/2022 1158   TRIG 74 07/28/2022 1158   HDL 48 07/28/2022 1158   CHOLHDL 2.5 07/28/2022 1158   CHOLHDL 5.3 11/23/2007 0350   VLDL 17 11/23/2007 0350   LDLCALC 56 07/28/2022 1158    Physical Exam:    VS:  BP (!) 143/77 (BP Location: Right Arm, Patient Position: Sitting)   Pulse 76   Ht 5\' 9"  (1.753 m)   Wt 167 lb 9.6 oz (76 kg)   SpO2 97%   BMI 24.75 kg/m     Wt Readings from Last 3 Encounters:  08/14/22 167 lb 9.6 oz (76 kg)  07/28/22 167 lb (75.8 kg)  06/13/22 164 lb 12.8 oz (74.8 kg)    Vitals:   08/14/22 1014 08/14/22 1045  BP: (!) 148/84 (!) 143/77  Pulse: 76   SpO2: 97%      GEN: Well nourished, well developed in no acute distress, sounds congested in his sinuses HEENT: Normal NECK: No JVD; No carotid bruits CARDIAC: S1/S2, RRR, no murmurs, rubs, gallops; 2+ peripheral pulses throughout, strong and equal bilaterally RESPIRATORY:  Clear to auscultation without rales, wheezing or rhonchi; nonproductive cough MUSCULOSKELETAL:  No edema; No deformity  SKIN: Warm and dry NEUROLOGIC:  Alert and oriented x 3 PSYCHIATRIC:  Normal affect   ASSESSMENT:    1. Coronary artery disease involving native coronary artery of native heart without angina pectoris   2. Heart failure with mildly reduced ejection fraction (HFmrEF) ([place])   3. Ischemic cardiomyopathy   4. Hyperlipidemia,  unspecified hyperlipidemia type   5. Medication management   6. Hypertension, unspecified type   7. Tobacco abuse   8. Acute cough   9. Upper respiratory tract infection, unspecified type     PLAN:    In order of  problems listed above:  CAD S/P BMS to RCA in 2009, angioplasty with DES to RCA in 2009. Stable with no anginal symptoms. No indication for ischemic evaluation. Continue ASA, Zetia, and Pravastatin. Increase Toprol-XL to 25 mg daily. Heart healthy diet and regular cardiovascular exercise encouraged.   HFmrEF, ischemic CM TTE in 2016 revealed EF 45%, akinesis of inferolateral and inferior myocardium, due to hx of inferior wall MI in 2009. Continue current medication regimen. Increase Toprol XL as mentioned above. Euvolemic and well compensated on exam. Low sodium diet, fluid restriction <2L, and daily weights encouraged. Educated to contact our office for weight gain of 2 lbs overnight or 5 lbs in one week. Heart healthy diet and regular cardiovascular exercise encouraged. Plan to update TTE in the future once GDMT cannot be further titrated.   HLD, medication management Recent labs have improved and revealed total cholesterol 119, HDL 48, LDL 56, and triglycerides 74.  Continue pravastatin and Zetia. Heart healthy diet and regular cardiovascular exercise encouraged.   HTN Initial blood pressure on arrival 148/84, repeat BP 143/77.  Discussed SBP goal is less than 130. Increase Toprol XL as mentioned above. Discussed to monitor BP at home at least 2 hours after medications and sitting for 5-10 minutes. Heart healthy diet and regular cardiovascular exercise encouraged.   Tobacco abuse, acute cough/ URI Continues to smoke.  Encouraged and discussed smoking cessation. Will have him stop over the counter cough medicine as I believe this is increasing his BP and start Tessalon pearls PRN for cough. Care precautions discussed.   6.  Disposition: Follow-up with me or Dr. Domenic Polite in 2-3  months or sooner if anything changes    Medication Adjustments/Labs and Tests Ordered: Current medicines are reviewed at length with the patient today.  Concerns regarding medicines are outlined above.  No orders of the defined types were placed in this encounter.  Meds ordered this encounter  Medications   metoprolol succinate (TOPROL-XL) 50 MG 24 hr tablet    Sig: Take 1 tablet (50 mg total) by mouth daily. Take with or immediately following a meal.    Dispense:  90 tablet    Refill:  3    06/13/22 New Start   benzonatate (TESSALON PERLES) 100 MG capsule    Sig: Take 1 capsule (100 mg total) by mouth 3 (three) times daily as needed for cough.    Dispense:  20 capsule    Refill:  0   ezetimibe (ZETIA) 10 MG tablet    Sig: Take 1 tablet (10 mg total) by mouth daily.    Dispense:  90 tablet    Refill:  3    06/13/22 New Start    Patient Instructions  Medication Instructions:  Your physician has recommended you make the following change in your medication:  -Increase Metoprolol to 50 mg tablets daily (1 whole tablet) -Stop over the counter cough syrup -Start Tessalon Pearls 100 mg tablets three times daily as needed for cough.   Labwork: None  Testing/Procedures: None  Follow-Up: Follow up with Finis Bud, NP in 2-3 months.   Any Other Special Instructions Will Be Listed Below (If Applicable).     If you need a refill on your cardiac medications before your next appointment, please call your pharmacy.    Signed, Finis Bud, NP  08/14/2022 11:12 AM    [place]

## 2022-08-14 NOTE — Patient Instructions (Signed)
Medication Instructions:  Your physician has recommended you make the following change in your medication:  -Increase Metoprolol to 50 mg tablets daily (1 whole tablet) -Stop over the counter cough syrup -Start Tessalon Pearls 100 mg tablets three times daily as needed for cough.   Labwork: None  Testing/Procedures: None  Follow-Up: Follow up with Finis Bud, NP in 2-3 months.   Any Other Special Instructions Will Be Listed Below (If Applicable).     If you need a refill on your cardiac medications before your next appointment, please call your pharmacy.

## 2022-08-22 ENCOUNTER — Encounter: Payer: Self-pay | Admitting: Family

## 2022-08-22 ENCOUNTER — Ambulatory Visit (INDEPENDENT_AMBULATORY_CARE_PROVIDER_SITE_OTHER): Payer: Medicare HMO | Admitting: Family

## 2022-08-22 VITALS — BP 162/82 | HR 72 | Temp 97.7°F | Ht 69.0 in | Wt 166.4 lb

## 2022-08-22 DIAGNOSIS — H6993 Unspecified Eustachian tube disorder, bilateral: Secondary | ICD-10-CM

## 2022-08-22 DIAGNOSIS — J440 Chronic obstructive pulmonary disease with acute lower respiratory infection: Secondary | ICD-10-CM

## 2022-08-22 DIAGNOSIS — R030 Elevated blood-pressure reading, without diagnosis of hypertension: Secondary | ICD-10-CM

## 2022-08-22 DIAGNOSIS — J209 Acute bronchitis, unspecified: Secondary | ICD-10-CM

## 2022-08-22 DIAGNOSIS — F17200 Nicotine dependence, unspecified, uncomplicated: Secondary | ICD-10-CM | POA: Diagnosis not present

## 2022-08-22 DIAGNOSIS — J441 Chronic obstructive pulmonary disease with (acute) exacerbation: Secondary | ICD-10-CM | POA: Diagnosis not present

## 2022-08-22 MED ORDER — BENZONATATE 200 MG PO CAPS: 200.0000 mg | ORAL_CAPSULE | Freq: Three times a day (TID) | ORAL | 1 refills | Status: DC | PRN

## 2022-08-22 MED ORDER — PREDNISONE 10 MG (21) PO TBPK: ORAL_TABLET | ORAL | 0 refills | Status: DC

## 2022-08-22 MED ORDER — BREZTRI AEROSPHERE 160-9-4.8 MCG/ACT IN AERO: 2.0000 | INHALATION_SPRAY | Freq: Two times a day (BID) | RESPIRATORY_TRACT | 11 refills | Status: DC

## 2022-08-22 MED ORDER — FLUTICASONE PROPIONATE 50 MCG/ACT NA SUSP: 2.0000 | Freq: Every day | NASAL | 6 refills | Status: DC

## 2022-08-22 NOTE — Patient Instructions (Signed)
Eustachian Tube Dysfunction  Eustachian tube dysfunction refers to a condition in which a blockage develops in the narrow passage that connects the middle ear to the back of the nose (eustachian tube). The eustachian tube regulates air pressure in the middle ear by letting air move between the ear and nose. It also helps to drain fluid from the middle ear space. Eustachian tube dysfunction can affect one or both ears. When the eustachian tube does not function properly, air pressure, fluid, or both can build up in the middle ear. What are the causes? This condition occurs when the eustachian tube becomes blocked or cannot open normally. Common causes of this condition include: Ear infections. Colds and other infections that affect the nose, mouth, and throat (upper respiratory tract). Allergies. Irritation from cigarette smoke. Irritation from stomach acid coming up into the esophagus (gastroesophageal reflux). The esophagus is the part of the body that moves food from the mouth to the stomach. Sudden changes in air pressure, such as from descending in an airplane or scuba diving. Abnormal growths in the nose or throat, such as: Growths that line the nose (nasal polyps). Abnormal growth of cells (tumors). Enlarged tissue at the back of the throat (adenoids). What increases the risk? You are more likely to develop this condition if: You smoke. You are overweight. You are a child who has: Certain birth defects of the mouth, such as cleft palate. Large tonsils or adenoids. What are the signs or symptoms? Common symptoms of this condition include: A feeling of fullness in the ear. Ear pain. Clicking or popping noises in the ear. Ringing in the ear (tinnitus). Hearing loss. Loss of balance. Dizziness. Symptoms may get worse when the air pressure around you changes, such as when you travel to an area of high elevation, fly on an airplane, or go scuba diving. How is this diagnosed? This  condition may be diagnosed based on: Your symptoms. A physical exam of your ears, nose, and throat. Tests, such as those that measure: The movement of your eardrum. Your hearing (audiometry). How is this treated? Treatment depends on the cause and severity of your condition. In mild cases, you may relieve your symptoms by moving air into your ears. This is called "popping the ears." In more severe cases, or if you have symptoms of fluid in your ears, treatment may include: Medicines to relieve congestion (decongestants). Medicines that treat allergies (antihistamines). Nasal sprays or ear drops that contain medicines that reduce swelling (steroids). A procedure to drain the fluid in your eardrum. In this procedure, a small tube may be placed in the eardrum to: Drain the fluid. Restore the air in the middle ear space. A procedure to insert a balloon device through the nose to inflate the opening of the eustachian tube (balloon dilation). Follow these instructions at home: Lifestyle Do not do any of the following until your health care provider approves: Travel to high altitudes. Fly in airplanes. Work in a pressurized cabin or room. Scuba dive. Do not use any products that contain nicotine or tobacco. These products include cigarettes, chewing tobacco, and vaping devices, such as e-cigarettes. If you need help quitting, ask your health care provider. Keep your ears dry. Wear fitted earplugs during showering and bathing. Dry your ears completely after. General instructions Take over-the-counter and prescription medicines only as told by your health care provider. Use techniques to help pop your ears as recommended by your health care provider. These may include: Chewing gum. Yawning. Frequent, forceful swallowing.   Closing your mouth, holding your nose closed, and gently blowing as if you are trying to blow air out of your nose. Keep all follow-up visits. This is important. Contact a  health care provider if: Your symptoms do not go away after treatment. Your symptoms come back after treatment. You are unable to pop your ears. You have: A fever. Pain in your ear. Pain in your head or neck. Fluid draining from your ear. Your hearing suddenly changes. You become very dizzy. You lose your balance. Get help right away if: You have a sudden, severe increase in any of your symptoms. Summary Eustachian tube dysfunction refers to a condition in which a blockage develops in the eustachian tube. It can be caused by ear infections, allergies, inhaled irritants, or abnormal growths in the nose or throat. Symptoms may include ear pain or fullness, hearing loss, or ringing in the ears. Mild cases are treated with techniques to unblock the ears, such as yawning or chewing gum. More severe cases are treated with medicines or procedures. This information is not intended to replace advice given to you by your health care provider. Make sure you discuss any questions you have with your health care provider. Document Revised: 09/20/2020 Document Reviewed: 09/20/2020 Elsevier Patient Education  2023 Elsevier Inc.  

## 2022-08-22 NOTE — Progress Notes (Signed)
Subjective:    Patient ID: [name], male    DOB: [birth year], 67 y.o.   MRN: [id number]  Chief Complaint  Patient presents with   Cough   ears stopped up    PT presents to the office today with sinus congestion, HOH,  that started three days ago. However, started coughing 5 weeks ago.  Cough This is a new problem. The current episode started more than 1 month ago. The problem has been unchanged. The problem occurs every few minutes. The cough is Productive of sputum. Associated symptoms include shortness of breath. Pertinent negatives include no chills, ear congestion, ear pain, fever, headaches, myalgias or wheezing. Risk factors for lung disease include smoking/tobacco exposure. He has tried rest and OTC cough suppressant for the symptoms. The treatment provided mild relief.  Ear Fullness  There is pain in both ears. This is a new problem. The current episode started in the past 7 days. The problem occurs constantly. The patient is experiencing no pain. Associated symptoms include coughing. Pertinent negatives include no headaches.      Review of Systems  Constitutional:  Negative for chills and fever.  HENT:  Negative for ear pain.   Respiratory:  Positive for cough and shortness of breath. Negative for wheezing.   Musculoskeletal:  Negative for myalgias.  Neurological:  Negative for headaches.  All other systems reviewed and are negative.      Objective:   Physical Exam Vitals reviewed.  Constitutional:      General: He is not in acute distress.    Appearance: He is well-developed.  HENT:     Head: Normocephalic.     Right Ear: A middle ear effusion is present.     Left Ear: A middle ear effusion is present.  Eyes:     General:        Right eye: No discharge.        Left eye: No discharge.     Pupils: Pupils are equal, round, and reactive to light.  Neck:     Thyroid: No thyromegaly.  Cardiovascular:     Rate and Rhythm: Normal rate and regular rhythm.      Heart sounds: Normal heart sounds. No murmur heard. Pulmonary:     Effort: Pulmonary effort is normal. No respiratory distress.     Breath sounds: Normal breath sounds. No wheezing.  Abdominal:     General: Bowel sounds are normal. There is no distension.     Palpations: Abdomen is soft.     Tenderness: There is no abdominal tenderness.  Musculoskeletal:        General: No tenderness. Normal range of motion.     Cervical back: Normal range of motion and neck supple.  Skin:    General: Skin is warm and dry.     Findings: No erythema or rash.  Neurological:     Mental Status: He is alert and oriented to person, place, and time.     Cranial Nerves: No cranial nerve deficit.     Deep Tendon Reflexes: Reflexes are normal and symmetric.  Psychiatric:        Behavior: Behavior normal.        Thought Content: Thought content normal.        Judgment: Judgment normal.       BP (!) 162/82   Pulse 72   Temp 97.7 F (36.5 C) (Temporal)   Ht 5\' 9"  (1.753 m)   Wt 166 lb 6.4 oz (75.5 kg)  SpO2 95%   BMI 24.57 kg/m      Assessment & Plan:  [name] comes in today with chief complaint of Cough and ears stopped up    Diagnosis and orders addressed:  1. Acute bronchitis with COPD ([place]) - Take meds as prescribed - Use a cool mist humidifier  -Use saline nose sprays frequently -Force fluids -For any cough or congestion  Use plain Mucinex- regular strength or max strength is fine -For fever or aces or pains- take tylenol or ibuprofen. -Throat lozenges if help -Follow up if symptoms worsen or do not improve  - predniSONE (STERAPRED UNI-PAK 21 TAB) 10 MG (21) TBPK tablet; Use as directed  Dispense: 21 tablet; Refill: 0 - fluticasone (FLONASE) 50 MCG/ACT nasal spray; Place 2 sprays into both nostrils daily.  Dispense: 16 g; Refill: 6 - benzonatate (TESSALON) 200 MG capsule; Take 1 capsule (200 mg total) by mouth 3 (three) times daily as needed.  Dispense: 30 capsule; Refill:  1  2. Current smoker  3. COPD exacerbation ([facility]) - predniSONE (STERAPRED UNI-PAK 21 TAB) 10 MG (21) TBPK tablet; Use as directed  Dispense: 21 tablet; Refill: 0 - fluticasone (FLONASE) 50 MCG/ACT nasal spray; Place 2 sprays into both nostrils daily.  Dispense: 16 g; Refill: 6 - benzonatate (TESSALON) 200 MG capsule; Take 1 capsule (200 mg total) by mouth 3 (three) times daily as needed.  Dispense: 30 capsule; Refill: 1 - Budeson-Glycopyrrol-Formoterol (BREZTRI AEROSPHERE) 160-9-4.8 MCG/ACT AERO; Inhale 2 puffs into the lungs 2 (two) times daily.  Dispense: 10.7 g; Refill: 11  4. Disorder of both eustachian tubes  5. Elevated blood pressure reading    Labs pending Health Maintenance reviewed Diet and exercise encouraged  Follow up plan: 2-4 weeks to follow up with PCP with HTN   Evelina Dun, FNP

## 2022-09-07 ENCOUNTER — Encounter: Payer: Self-pay | Admitting: Family Medicine

## 2022-09-07 ENCOUNTER — Ambulatory Visit (INDEPENDENT_AMBULATORY_CARE_PROVIDER_SITE_OTHER): Payer: Medicare HMO | Admitting: Family Medicine

## 2022-09-07 VITALS — BP 133/65 | HR 66 | Temp 97.6°F | Ht 69.0 in | Wt 166.0 lb

## 2022-09-07 DIAGNOSIS — H65193 Other acute nonsuppurative otitis media, bilateral: Secondary | ICD-10-CM | POA: Diagnosis not present

## 2022-09-07 NOTE — Patient Instructions (Signed)
Sudafed or other decongestant. Continue flonase nasal spray daily and levocetirizine (xyzal) nightly. It can take up to 12 weeks for the fluid behind ears to clear. There are no signs of infection today. Follow up for fever, increased pain, or drainage from ears.

## 2022-09-07 NOTE — Progress Notes (Signed)
   Acute Office Visit  Subjective:     Patient ID: [name], male    DOB: Nov 23, 1955, 67 y.o.   MRN: [id number]  Chief Complaint  Patient presents with   Otalgia    Otalgia  There is pain in both ears. This is a new problem. Episode onset: 3 weeks. The problem occurs constantly. The problem has been unchanged. There has been no fever. The patient is experiencing no pain (congestion- pressure). Associated symptoms include coughing (chronic), hearing loss (muffled) and rhinorrhea (mild). Pertinent negatives include no ear discharge, headaches or sore throat. Treatments tried: flonase, xyzal, prednisone, abx. The treatment provided mild relief. There is no history of a chronic ear infection or a tympanostomy tube.    Review of Systems  HENT:  Positive for ear pain, hearing loss (muffled) and rhinorrhea (mild). Negative for ear discharge and sore throat.   Respiratory:  Positive for cough (chronic).   Neurological:  Negative for headaches.        Objective:    BP 133/65   Pulse 66   Temp 97.6 F (36.4 C) (Temporal)   Ht 5\' 9"  (1.753 m)   Wt 166 lb (75.3 kg)   SpO2 97%   BMI 24.51 kg/m    Physical Exam Vitals and nursing note reviewed.  Constitutional:      General: He is not in acute distress.    Appearance: He is not ill-appearing, toxic-appearing or diaphoretic.  HENT:     Right Ear: Ear canal and external ear normal. A middle ear effusion is present. Tympanic membrane is not scarred, perforated, erythematous, retracted or bulging.     Left Ear: Ear canal and external ear normal. A middle ear effusion is present. Tympanic membrane is not scarred, perforated, erythematous, retracted or bulging.     Nose: Rhinorrhea present.     Mouth/Throat:     Mouth: Mucous membranes are moist.     Pharynx: Oropharynx is clear.  Eyes:     General:        Right eye: No discharge.        Left eye: No discharge.     Conjunctiva/sclera: Conjunctivae normal.  Cardiovascular:      Rate and Rhythm: Normal rate and regular rhythm.     Heart sounds: Normal heart sounds. No murmur heard. Pulmonary:     Effort: Pulmonary effort is normal. No respiratory distress.     Breath sounds: Normal breath sounds.  Musculoskeletal:     Cervical back: Neck supple. No rigidity.  Skin:    General: Skin is warm and dry.  Neurological:     General: No focal deficit present.     Mental Status: He is alert and oriented to person, place, and time.  Psychiatric:        Mood and Affect: Mood normal.        Behavior: Behavior normal.     No results found for any visits on 09/07/22.      Assessment & Plan:   [name] was seen today for otalgia.  Diagnoses and all orders for this visit:  Acute MEE (middle ear effusion), bilateral No signs of infection today. Discussed can take up to 12 weeks for fluid to clear. Continue flonase and xyzal. OTC decongestant prn. Return to office for new or worsening symptoms, or if symptoms persist.   The patient indicates understanding of these issues and agrees with the plan.   Gwenlyn Perking, FNP

## 2022-09-20 ENCOUNTER — Telehealth: Payer: Self-pay | Admitting: Family Medicine

## 2022-09-20 DIAGNOSIS — Z13220 Encounter for screening for lipoid disorders: Secondary | ICD-10-CM

## 2022-09-20 DIAGNOSIS — Z130 Encounter for screening for diseases of the blood and blood-forming organs and certain disorders involving the immune mechanism: Secondary | ICD-10-CM

## 2022-09-20 NOTE — Telephone Encounter (Signed)
Pt advised per Tiffany's last OV notes it can take up to 12 weeks for the fluid to clear and pt voiced understanding.

## 2022-10-11 NOTE — Telephone Encounter (Signed)
Labs ordered.

## 2022-10-19 ENCOUNTER — Ambulatory Visit: Payer: Medicare HMO | Admitting: Nurse Practitioner

## 2022-11-02 ENCOUNTER — Ambulatory Visit (INDEPENDENT_AMBULATORY_CARE_PROVIDER_SITE_OTHER): Payer: Medicare HMO

## 2022-11-02 ENCOUNTER — Ambulatory Visit: Payer: Medicare HMO | Attending: Nurse Practitioner | Admitting: Nurse Practitioner

## 2022-11-02 ENCOUNTER — Encounter: Payer: Self-pay | Admitting: Nurse Practitioner

## 2022-11-02 VITALS — BP 142/86 | HR 68 | Ht 69.0 in | Wt 171.6 lb

## 2022-11-02 VITALS — Ht 69.0 in | Wt 169.0 lb

## 2022-11-02 DIAGNOSIS — I2510 Atherosclerotic heart disease of native coronary artery without angina pectoris: Secondary | ICD-10-CM | POA: Diagnosis not present

## 2022-11-02 DIAGNOSIS — Z79899 Other long term (current) drug therapy: Secondary | ICD-10-CM | POA: Diagnosis not present

## 2022-11-02 DIAGNOSIS — I10 Essential (primary) hypertension: Secondary | ICD-10-CM

## 2022-11-02 DIAGNOSIS — R600 Localized edema: Secondary | ICD-10-CM

## 2022-11-02 DIAGNOSIS — I5022 Chronic systolic (congestive) heart failure: Secondary | ICD-10-CM

## 2022-11-02 DIAGNOSIS — I255 Ischemic cardiomyopathy: Secondary | ICD-10-CM

## 2022-11-02 DIAGNOSIS — Z0000 Encounter for general adult medical examination without abnormal findings: Secondary | ICD-10-CM

## 2022-11-02 DIAGNOSIS — E785 Hyperlipidemia, unspecified: Secondary | ICD-10-CM

## 2022-11-02 DIAGNOSIS — Z0100 Encounter for examination of eyes and vision without abnormal findings: Secondary | ICD-10-CM

## 2022-11-02 DIAGNOSIS — Z720 Tobacco use: Secondary | ICD-10-CM | POA: Diagnosis not present

## 2022-11-02 NOTE — Patient Instructions (Signed)
Mr. [name] , Thank you for taking time to come for your Medicare Wellness Visit. I appreciate your ongoing commitment to your health goals. Please review the following plan we discussed and let me know if I can assist you in the future.   These are the goals we discussed:  Goals      DIET - INCREASE WATER INTAKE        This is a list of the screening recommended for you and due dates:  Health Maintenance  Topic Date Due   COVID-19 Vaccine (4 - 2023-24 season) 03/24/2022   Flu Shot  02/22/2023   Medicare Annual Wellness Visit  11/02/2023   Colon Cancer Screening  01/17/2024   DTaP/Tdap/Td vaccine (3 - Td or Tdap) 08/25/2031   Pneumonia Vaccine  Completed   Hepatitis C Screening: USPSTF Recommendation to screen - Ages 44-79 yo.  Completed   Zoster (Shingles) Vaccine  Completed   HPV Vaccine  Aged Out    Advanced directives: Advance directive discussed with you today. I have provided a copy for you to complete at home and have notarized. Once this is complete please bring a copy in to our office so we can scan it into your chart.   Conditions/risks identified: Aim for 30 minutes of exercise or brisk walking, 6-8 glasses of water, and 5 servings of fruits and vegetables each day.   Next appointment: Follow up in one year for your annual wellness visit.   Preventive Care 40 Years and Older, Male  Preventive care refers to lifestyle choices and visits with your health care provider that can promote health and wellness. What does preventive care include? A yearly physical exam. This is also called an annual well check. Dental exams once or twice a year. Routine eye exams. Ask your health care provider how often you should have your eyes checked. Personal lifestyle choices, including: Daily care of your teeth and gums. Regular physical activity. Eating a healthy diet. Avoiding tobacco and drug use. Limiting alcohol use. Practicing safe sex. Taking low doses of aspirin every  day. Taking vitamin and mineral supplements as recommended by your health care provider. What happens during an annual well check? The services and screenings done by your health care provider during your annual well check will depend on your age, overall health, lifestyle risk factors, and family history of disease. Counseling  Your health care provider may ask you questions about your: Alcohol use. Tobacco use. Drug use. Emotional well-being. Home and relationship well-being. Sexual activity. Eating habits. History of falls. Memory and ability to understand (cognition). Work and work Astronomer. Screening  You may have the following tests or measurements: Height, weight, and BMI. Blood pressure. Lipid and cholesterol levels. These may be checked every 5 years, or more frequently if you are over 37 years old. Skin check. Lung cancer screening. You may have this screening every year starting at age 41 if you have a 30-pack-year history of smoking and currently smoke or have quit within the past 15 years. Fecal occult blood test (FOBT) of the stool. You may have this test every year starting at age 67. Flexible sigmoidoscopy or colonoscopy. You may have a sigmoidoscopy every 5 years or a colonoscopy every 10 years starting at age 56. Prostate cancer screening. Recommendations will vary depending on your family history and other risks. Hepatitis C blood test. Hepatitis B blood test. Sexually transmitted disease (STD) testing. Diabetes screening. This is done by checking your blood sugar (glucose) after you have  not eaten for a while (fasting). You may have this done every 1-3 years. Abdominal aortic aneurysm (AAA) screening. You may need this if you are a current or former smoker. Osteoporosis. You may be screened starting at age 55 if you are at high risk. Talk with your health care provider about your test results, treatment options, and if necessary, the need for more  tests. Vaccines  Your health care provider may recommend certain vaccines, such as: Influenza vaccine. This is recommended every year. Tetanus, diphtheria, and acellular pertussis (Tdap, Td) vaccine. You may need a Td booster every 10 years. Zoster vaccine. You may need this after age 71. Pneumococcal 13-valent conjugate (PCV13) vaccine. One dose is recommended after age 65. Pneumococcal polysaccharide (PPSV23) vaccine. One dose is recommended after age 15. Talk to your health care provider about which screenings and vaccines you need and how often you need them. This information is not intended to replace advice given to you by your health care provider. Make sure you discuss any questions you have with your health care provider. Document Released: 08/06/2015 Document Revised: 03/29/2016 Document Reviewed: 05/11/2015 Elsevier Interactive Patient Education  2017 [employer] Prevention in the Home Falls can cause injuries. They can happen to people of all ages. There are many things you can do to make your home safe and to help prevent falls. What can I do on the outside of my home? Regularly fix the edges of walkways and driveways and fix any cracks. Remove anything that might make you trip as you walk through a door, such as a raised step or threshold. Trim any bushes or trees on the path to your home. Use bright outdoor lighting. Clear any walking paths of anything that might make someone trip, such as rocks or tools. Regularly check to see if handrails are loose or broken. Make sure that both sides of any steps have handrails. Any raised decks and porches should have guardrails on the edges. Have any leaves, snow, or ice cleared regularly. Use sand or salt on walking paths during winter. Clean up any spills in your garage right away. This includes oil or grease spills. What can I do in the bathroom? Use night lights. Install grab bars by the toilet and in the tub and shower.  Do not use towel bars as grab bars. Use non-skid mats or decals in the tub or shower. If you need to sit down in the shower, use a plastic, non-slip stool. Keep the floor dry. Clean up any water that spills on the floor as soon as it happens. Remove soap buildup in the tub or shower regularly. Attach bath mats securely with double-sided non-slip rug tape. Do not have throw rugs and other things on the floor that can make you trip. What can I do in the bedroom? Use night lights. Make sure that you have a light by your bed that is easy to reach. Do not use any sheets or blankets that are too big for your bed. They should not hang down onto the floor. Have a firm chair that has side arms. You can use this for support while you get dressed. Do not have throw rugs and other things on the floor that can make you trip. What can I do in the kitchen? Clean up any spills right away. Avoid walking on wet floors. Keep items that you use a lot in easy-to-reach places. If you need to reach something above you, use a strong step stool  that has a grab bar. Keep electrical cords out of the way. Do not use floor polish or wax that makes floors slippery. If you must use wax, use non-skid floor wax. Do not have throw rugs and other things on the floor that can make you trip. What can I do with my stairs? Do not leave any items on the stairs. Make sure that there are handrails on both sides of the stairs and use them. Fix handrails that are broken or loose. Make sure that handrails are as long as the stairways. Check any carpeting to make sure that it is firmly attached to the stairs. Fix any carpet that is loose or worn. Avoid having throw rugs at the top or bottom of the stairs. If you do have throw rugs, attach them to the floor with carpet tape. Make sure that you have a light switch at the top of the stairs and the bottom of the stairs. If you do not have them, ask someone to add them for you. What else  can I do to help prevent falls? Wear shoes that: Do not have high heels. Have rubber bottoms. Are comfortable and fit you well. Are closed at the toe. Do not wear sandals. If you use a stepladder: Make sure that it is fully opened. Do not climb a closed stepladder. Make sure that both sides of the stepladder are locked into place. Ask someone to hold it for you, if possible. Clearly mark and make sure that you can see: Any grab bars or handrails. First and last steps. Where the edge of each step is. Use tools that help you move around (mobility aids) if they are needed. These include: Canes. Walkers. Scooters. Crutches. Turn on the lights when you go into a dark area. Replace any light bulbs as soon as they burn out. Set up your furniture so you have a clear path. Avoid moving your furniture around. If any of your floors are uneven, fix them. If there are any pets around you, be aware of where they are. Review your medicines with your doctor. Some medicines can make you feel dizzy. This can increase your chance of falling. Ask your doctor what other things that you can do to help prevent falls. This information is not intended to replace advice given to you by your health care provider. Make sure you discuss any questions you have with your health care provider. Document Released: 05/06/2009 Document Revised: 12/16/2015 Document Reviewed: 08/14/2014 Elsevier Interactive Patient Education  2017 [employer].

## 2022-11-02 NOTE — Patient Instructions (Addendum)
Medication Instructions:  Stop Zetia (Ezetimibe) Continue all other medications.     Labwork: BNP, BMET - orders given today   Testing/Procedures: Your physician has requested that you have an echocardiogram. Echocardiography is a painless test that uses sound waves to create images of your heart. It provides your doctor with information about the size and shape of your heart and how well your heart's chambers and valves are working. This procedure takes approximately one hour. There are no restrictions for this procedure. Please do NOT wear cologne, perfume, aftershave, or lotions (deodorant is allowed). Please arrive 15 minutes prior to your appointment time.   Follow-Up: Office will contact with results via phone, letter or mychart.    4-6 weeks   Any Other Special Instructions Will Be Listed Below (If Applicable). Low compression knee high stockings - order given today   If you need a refill on your cardiac medications before your next appointment, please call your pharmacy.

## 2022-11-02 NOTE — Progress Notes (Signed)
Subjective:   [name] is a 67 y.o. male who presents for an Initial Medicare Annual Wellness Visit. I connected with  [name] on 11/02/22 by a audio enabled telemedicine application and verified that I am speaking with the correct person using two identifiers.  Patient Location: Home  Provider Location: Home Office  I discussed the limitations of evaluation and management by telemedicine. The patient expressed understanding and agreed to proceed.  Review of Systems     Cardiac Risk Factors include: advanced age (>[id number], 83>65 women);male gender;dyslipidemia;hypertension     Objective:    Today's Vitals   11/02/22 0850  Weight: 169 lb (76.7 kg)  Height: 5\' 9"  (1.753 m)   Body mass index is 24.96 kg/m.     11/02/2022    8:54 AM 09/13/2017   10:20 AM 09/07/2017   11:50 AM  Advanced Directives  Does Patient Have a Medical Advance Directive? No No No  Would patient like information on creating a medical advance directive? No - Patient declined No - Patient declined No - Patient declined    Current Medications (verified) Outpatient Encounter Medications as of 11/02/2022  Medication Sig   albuterol (VENTOLIN HFA) 108 (90 Base) MCG/ACT inhaler Inhale 2 puffs into the lungs every 6 (six) hours as needed for wheezing or shortness of breath.   aspirin EC (EQ ASPIRIN ADULT LOW DOSE) 81 MG tablet Take 1 tablet (81 mg total) by mouth daily.   benzonatate (TESSALON) 200 MG capsule Take 1 capsule (200 mg total) by mouth 3 (three) times daily as needed.   Budeson-Glycopyrrol-Formoterol (BREZTRI AEROSPHERE) 160-9-4.8 MCG/ACT AERO Inhale 2 puffs into the lungs 2 (two) times daily.   ezetimibe (ZETIA) 10 MG tablet Take 1 tablet (10 mg total) by mouth daily.   fluticasone (FLONASE) 50 MCG/ACT nasal spray Place 2 sprays into both nostrils daily.   levocetirizine (XYZAL) 5 MG tablet Take 1 tablet (5 mg total) by mouth at bedtime as needed for allergies (runny nose).   metoprolol  succinate (TOPROL-XL) 50 MG 24 hr tablet Take 1 tablet (50 mg total) by mouth daily. Take with or immediately following a meal.   pravastatin (PRAVACHOL) 40 MG tablet Take 1 tablet (40 mg total) by mouth every evening.   No facility-administered encounter medications on file as of 11/02/2022.    Allergies (verified) Patient has no known allergies.   History: Past Medical History:  Diagnosis Date   CAD (coronary artery disease)    BMS to RCA May 2009, cutting balloon angioplasty for restenosis July 2009, then DES to RCA  August 2009.   History of inferior wall myocardial infarction 11/2007   Hyperlipidemia    IBS (irritable bowel syndrome)    Loose body of right knee 08/2017   Medial meniscus tear 08/2017   right knee   MYOCARDIAL INFARCTION, INFERIOR WALL 11/27/2008   Qualifier: Diagnosis of  By: Genelle GatherShoffner CMA, [place]     Wears dentures    upper   Past Surgical History:  Procedure Laterality Date   APPENDECTOMY  07/12/2002   CARDIAC CATHETERIZATION  11/22/2007   stent RCA   CARDIAC CATHETERIZATION  02/21/2008   restenosis RCA - angioplasty   CARDIAC CATHETERIZATION  02/28/2008   drug-eluting stent RCA   CHONDROPLASTY Right 09/13/2017   Procedure: CHONDROPLASTY RIGHT KNEE;  Surgeon: Bjorn PippinVarkey, Dax T, MD;  Location: [facility];  Service: Orthopedics;  Laterality: Right;   FOOT SURGERY Right    KNEE ARTHROSCOPY Right  KNEE ARTHROSCOPY WITH MEDIAL MENISECTOMY Right 09/13/2017   Procedure: RIGHT KNEE ARTHROSCOPY WITH PARTIAL MEDIAL MENISCECTOMY;  Surgeon: Bjorn Pippin, MD;  Location: [facility];  Service: Orthopedics;  Laterality: Right;   Family History  Problem Relation Age of Onset   Heart attack Father        h/o CABG and died of massive heart attackat age 56   Thyroid disease Sister    Social History   Socioeconomic History   Marital status: Widowed    Spouse name: Not on file   Number of children: Not on file   Years of education: Not on  file   Highest education level: Not on file  Occupational History   Occupation: Works at a plant   Occupation: [employer]   Occupation: Builds bird houses  Tobacco Use   Smoking status: Every Day    Years: 30    Types: Cigarettes    Passive exposure: Never   Smokeless tobacco: Never   Tobacco comments:    1 pack/3 days  Vaping Use   Vaping Use: Never used  Substance and Sexual Activity   Alcohol use: No   Drug use: No   Sexual activity: Not Currently  Other Topics Concern   Not on file  Social History Narrative   Not on file   Social Determinants of Health   Financial Resource Strain: Low Risk  (11/02/2022)   Overall Financial Resource Strain (CARDIA)    Difficulty of Paying Living Expenses: Not hard at all  Food Insecurity: No Food Insecurity (11/02/2022)   Hunger Vital Sign    Worried About Running Out of Food in the Last Year: Never true    Ran Out of Food in the Last Year: Never true  Transportation Needs: No Transportation Needs (11/02/2022)   PRAPARE - Administrator, Civil Service (Medical): No    Lack of Transportation (Non-Medical): No  Physical Activity: Sufficiently Active (11/02/2022)   Exercise Vital Sign    Days of Exercise per Week: 5 days    Minutes of Exercise per Session: 30 min  Stress: No Stress Concern Present (11/02/2022)   [employer] of Occupational Health - Occupational Stress Questionnaire    Feeling of Stress : Not at all  Social Connections: Moderately Integrated (11/02/2022)   Social Connection and Isolation Panel [NHANES]    Frequency of Communication with Friends and Family: More than three times a week    Frequency of Social Gatherings with Friends and Family: More than three times a week    Attends Religious Services: 1 to 4 times per year    Active Member of [employer] or Organizations: Yes    Attends Banker Meetings: More than 4 times per year    Marital Status: Widowed    Tobacco  Counseling Ready to quit: No Counseling given: Not Answered Tobacco comments: 1 pack/3 days   Clinical Intake:  Pre-visit preparation completed: Yes  Pain : No/denies pain     Nutritional Risks: None Diabetes: No  How often do you need to have someone help you when you read instructions, pamphlets, or other written materials from your doctor or pharmacy?: 1 - Never  Diabetic?no   Interpreter Needed?: No  Information entered by :: Renie Ora, LPN   Activities of Daily Living    11/02/2022    8:54 AM  In your present state of health, do you have any difficulty performing the following activities:  Hearing? 0  Vision? 0  Difficulty concentrating or making decisions? 0  Walking or climbing stairs? 0  Dressing or bathing? 0  Doing errands, shopping? 0  Preparing Food and eating ? N  Using the Toilet? N  In the past six months, have you accidently leaked urine? N  Do you have problems with loss of bowel control? N  Managing your Medications? N  Managing your Finances? N  Housekeeping or managing your Housekeeping? N    Patient Care Team: Gabriel Earing, FNP as PCP - General (Family Medicine) Jonelle Sidle, MD as PCP - Cardiology (Cardiology)  Indicate any recent Medical Services you may have received from other than [facility] providers in the past year (date may be approximate).     Assessment:   This is a routine wellness examination for [name].  Hearing/Vision screen Vision Screening - Comments:: Referral 11/02/2022  Dietary issues and exercise activities discussed: Current Exercise Habits: Home exercise routine, Type of exercise: walking, Time (Minutes): 30, Frequency (Times/Week): 5, Weekly Exercise (Minutes/Week): 150, Intensity: Mild, Exercise limited by: None identified   Goals Addressed             This Visit's Progress    DIET - INCREASE WATER INTAKE         Depression Screen    11/02/2022    8:53 AM 09/07/2022    8:22 AM 07/28/2022    11:36 AM 01/19/2022    8:09 AM 07/01/2021    2:29 PM 01/06/2021   10:05 AM 09/07/2020    4:13 PM  PHQ 2/9 Scores  PHQ - 2 Score 0 0 0 0 0 0 0  PHQ- 9 Score 0 0 1 1 1  0     Fall Risk    11/02/2022    8:52 AM 09/07/2022    8:22 AM 08/22/2022    9:33 AM 07/28/2022   11:36 AM 01/19/2022    8:09 AM  Fall Risk   Falls in the past year? 0 0 0 0 0  Number falls in past yr: 0  0    Injury with Fall? 0  0    Risk for fall due to : No Fall Risks  No Fall Risks    Follow up Falls prevention discussed        FALL RISK PREVENTION PERTAINING TO THE HOME:  Any stairs in or around the home? No  If so, are there any without handrails? No  Home free of loose throw rugs in walkways, pet beds, electrical cords, etc? Yes  Adequate lighting in your home to reduce risk of falls? Yes   ASSISTIVE DEVICES UTILIZED TO PREVENT FALLS:  Life alert? No  Use of a cane, walker or w/c? No  Grab bars in the bathroom? Yes  Shower chair or bench in shower? Yes  Elevated toilet seat or a handicapped toilet? No        11/02/2022    8:55 AM  6CIT Screen  What Year? 0 points  What month? 0 points  What time? 0 points  Count back from 20 0 points  Months in reverse 0 points  Repeat phrase 0 points  Total Score 0 points    Immunizations Immunization History  Administered Date(s) Administered   Influenza,inj,Quad PF,6+ Mos 04/17/2019   PFIZER(Purple Top)SARS-COV-2 Vaccination 10/30/2019, 11/21/2019, 05/22/2020   PNEUMOCOCCAL CONJUGATE-20 10/06/2021   Pneumococcal Conjugate-13 12/21/2015   Tdap 12/20/2013, 08/24/2021   Zoster Recombinat (Shingrix) 08/24/2021, 11/12/2021    TDAP status: Up to date  Flu Vaccine  status: Declined, Education has been provided regarding the importance of this vaccine but patient still declined. Advised may receive this vaccine at local pharmacy or Health Dept. Aware to provide a copy of the vaccination record if obtained from local pharmacy or Health Dept. Verbalized acceptance  and understanding.  Pneumococcal vaccine status: Up to date  Covid-19 vaccine status: Completed vaccines  Qualifies for Shingles Vaccine? Yes   Zostavax completed Yes   Shingrix Completed?: Yes  Screening Tests Health Maintenance  Topic Date Due   COVID-19 Vaccine (4 - 2023-24 season) 03/24/2022   INFLUENZA VACCINE  02/22/2023   Medicare Annual Wellness (AWV)  11/02/2023   COLONOSCOPY (Pts 45-2yrs Insurance coverage will need to be confirmed)  01/17/2024   DTaP/Tdap/Td (3 - Td or Tdap) 08/25/2031   Pneumonia Vaccine 45+ Years old  Completed   Hepatitis C Screening  Completed   Zoster Vaccines- Shingrix  Completed   HPV VACCINES  Aged Out    Health Maintenance  Health Maintenance Due  Topic Date Due   COVID-19 Vaccine (4 - 2023-24 season) 03/24/2022    Colorectal cancer screening: Type of screening: Colonoscopy. Completed 01/16/2014. Repeat every 10 years  Lung Cancer Screening: (Low Dose CT Chest recommended if Age 52-80 years, 30 pack-year currently smoking OR have quit w/in 15years.) does qualify.   Lung Cancer Screening Referral: declined   Additional Screening:  Hepatitis C Screening: does not qualify; Completed 04/17/2019  Vision Screening: Recommended annual ophthalmology exams for early detection of glaucoma and other disorders of the eye. Is the patient up to date with their annual eye exam?  No  Who is the provider or what is the name of the office in which the patient attends annual eye exams? None referral 11/02/2022 If pt is not established with a provider, would they like to be referred to a provider to establish care? No .   Dental Screening: Recommended annual dental exams for proper oral hygiene  Community Resource Referral / Chronic Care Management: CRR required this visit?  No   CCM required this visit?  No      Plan:     I have personally reviewed and noted the following in the patient's chart:   Medical and social history Use of  alcohol, tobacco or illicit drugs  Current medications and supplements including opioid prescriptions. Patient is not currently taking opioid prescriptions. Functional ability and status Nutritional status Physical activity Advanced directives List of other physicians Hospitalizations, surgeries, and ER visits in previous 12 months Vitals Screenings to include cognitive, depression, and falls Referrals and appointments  In addition, I have reviewed and discussed with patient certain preventive protocols, quality metrics, and best practice recommendations. A written personalized care plan for preventive services as well as general preventive health recommendations were provided to patient.     Lorrene Reid, LPN   01/19/6380   Nurse Notes: none

## 2022-11-02 NOTE — Progress Notes (Signed)
Cardiology Office Note:    Date:  11/02/2022   ID:  [name], DOB [birth year], MRN [id number]  PCP:  Gabriel Earing, FNP    [facility] Providers Cardiologist:  Nona Dell, MD     Referring MD: Gabriel Earing, FNP   CC: Here for follow-up  History of Present Illness:    [name] is a very pleasant 67 y.o. male with a hx of the following:  CAD, s/p BMS to RCA in 2009, angioplasty with DES to RCA in 2009 HFmrEF, ischemic CM (LVEF 45% 06/2015) Dyslipidemia Irritable bowel syndrome Tobacco Abuse  Previous CV history of CAD with BMS placed to RCA in 2009, also underwent Cutting Balloon angioplasty for restenosis in 2009 with drug-eluting stent to RCA in August 2009.  Previous history also includes inferior wall MI, hypertension, hyperlipidemia.   NST in 2016 showed abnormal ST segment depression noted in leads V5 and V6 toward, peak heart rate in the absence of chest pain.  There was a large perfusion defect in the inferior and inferolateral wall consistent with scar from previous infarct, partial reversibility in the lateral portion of this defect consistent with mild peri-infarct ischemia, study was intermediate risk with nuclear stress EF at 41%. TTE in 2016 revealed EF at 45%, there was akinesis of the inferolateral and inferior myocardium, grade 1 DD, trivial MR, trivial TR.  Seen by Rennis Harding, NP on March 31, 2021 for 1 year follow-up.  Overall he was doing well denied any chest pain or concerning cardiac complaints or issues.  Saw him for follow-up 06/13/2022.  Was doing well. Denied any chest pain, shortness of breath, palpitations, orthopnea, PND, dizziness, syncope, presyncope, swelling, significant weight changes, acute bleeding, or claudication. Continued to smoke. Started on Toprol XL 12.5 mg daily as well as Zetia.   Saw for f/u 08/14/2022. Admitted to recent URI symptoms.   Denied chest pain. Overall was doing well from a cardiac perspective.    Today he presents for follow-up. He admits to sinus infection/URI symptoms. Endorses leg swelling in lower extremities. More noticed in left leg than right leg. Denies any claudication. Denies any chest pain, shortness of breath, palpitations, syncope, presyncope, dizziness, orthopnea, PND, significant weight changes, or acute bleeding. Admits to work-related stress. Could not tolerate Zetia as this caused cramping.  SH:  Enjoys building bird houses.  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   Past Medical History:  Diagnosis Date   CAD (coronary artery disease)    BMS to RCA May 2009, cutting balloon angioplasty for restenosis July 2009, then DES to RCA  August 2009.   History of inferior wall myocardial infarction 11/2007   Hyperlipidemia    IBS (irritable bowel syndrome)    Loose body of right knee 08/2017   Medial meniscus tear 08/2017   right knee   MYOCARDIAL INFARCTION, INFERIOR WALL 11/27/2008   Qualifier: Diagnosis of  By: Genelle Gather CMA, [place]     Wears dentures    upper    Past Surgical History:  Procedure Laterality Date   APPENDECTOMY  07/12/2002   CARDIAC CATHETERIZATION  11/22/2007   stent RCA   CARDIAC CATHETERIZATION  02/21/2008   restenosis RCA - angioplasty   CARDIAC CATHETERIZATION  02/28/2008   drug-eluting stent RCA   CHONDROPLASTY Right 09/13/2017   Procedure: CHONDROPLASTY RIGHT KNEE;  Surgeon: Bjorn Pippin, MD;  Location: [facility];  Service: Orthopedics;  Laterality: Right;   FOOT SURGERY Right  KNEE ARTHROSCOPY Right    KNEE ARTHROSCOPY WITH MEDIAL MENISECTOMY Right 09/13/2017   Procedure: RIGHT KNEE ARTHROSCOPY WITH PARTIAL MEDIAL MENISCECTOMY;  Surgeon: Bjorn PippinVarkey, Dax T, MD;  Location: [facility];  Service: Orthopedics;  Laterality: Right;     Current Meds  Medication Sig   albuterol (VENTOLIN HFA) 108 (90 Base) MCG/ACT inhaler Inhale 2 puffs into the lungs every 6 (six) hours as needed for  wheezing or shortness of breath.   aspirin EC (EQ ASPIRIN ADULT LOW DOSE) 81 MG tablet Take 1 tablet (81 mg total) by mouth daily.   benzonatate (TESSALON) 200 MG capsule Take 1 capsule (200 mg total) by mouth 3 (three) times daily as needed.   Budeson-Glycopyrrol-Formoterol (BREZTRI AEROSPHERE) 160-9-4.8 MCG/ACT AERO Inhale 2 puffs into the lungs 2 (two) times daily.   ezetimibe (ZETIA) 10 MG tablet Take 1 tablet (10 mg total) by mouth daily.   fluticasone (FLONASE) 50 MCG/ACT nasal spray Place 2 sprays into both nostrils daily.   levocetirizine (XYZAL) 5 MG tablet Take 1 tablet (5 mg total) by mouth at bedtime as needed for allergies (runny nose).   metoprolol succinate (TOPROL-XL) 50 MG 24 hr tablet Take 1 tablet (50 mg total) by mouth daily. Take with or immediately following a meal.   pravastatin (PRAVACHOL) 40 MG tablet Take 1 tablet (40 mg total) by mouth every evening.   Allergies:   Patient has no known allergies.   Social History   Socioeconomic History   Marital status: Widowed    Spouse name: Not on file   Number of children: Not on file   Years of education: Not on file   Highest education level: Not on file  Occupational History   Occupation: Works at a plant   Occupation: [employer]   Occupation: Builds bird houses  Tobacco Use   Smoking status: Every Day    Years: 30    Types: Cigarettes    Passive exposure: Never   Smokeless tobacco: Never   Tobacco comments:    1 pack/3 days  Vaping Use   Vaping Use: Never used  Substance and Sexual Activity   Alcohol use: No   Drug use: No   Sexual activity: Not Currently  Other Topics Concern   Not on file  Social History Narrative   Not on file   Social Determinants of Health   Financial Resource Strain: Low Risk  (11/02/2022)   Overall Financial Resource Strain (CARDIA)    Difficulty of Paying Living Expenses: Not hard at all  Food Insecurity: No Food Insecurity (11/02/2022)   Hunger Vital Sign     Worried About Running Out of Food in the Last Year: Never true    Ran Out of Food in the Last Year: Never true  Transportation Needs: No Transportation Needs (11/02/2022)   PRAPARE - Administrator, Civil ServiceTransportation    Lack of Transportation (Medical): No    Lack of Transportation (Non-Medical): No  Physical Activity: Sufficiently Active (11/02/2022)   Exercise Vital Sign    Days of Exercise per Week: 5 days    Minutes of Exercise per Session: 30 min  Stress: No Stress Concern Present (11/02/2022)   [employer] - Occupational Stress Questionnaire    Feeling of Stress : Not at all  Social Connections: Moderately Integrated (11/02/2022)   Social Connection and Isolation Panel [NHANES]    Frequency of Communication with Friends and Family: More than three times a week    Frequency of Social  Gatherings with Friends and Family: More than three times a week    Attends Religious Services: 1 to 4 times per year    Active Member of Clubs or Organizations: Yes    Attends Banker Meetings: More than 4 times per year    Marital Status: Widowed     Family History: The patient's family history includes Heart attack in his father; Thyroid disease in his sister.  ROS:    Please see the history of present illness.    All other systems reviewed and are negative.  EKGs/Labs/Other Studies Reviewed:    The following studies were reviewed today:   EKG:  EKG is not ordered today.   2D complete echocardiogram on July 12, 2015: - Left ventricle: The cavity size was normal. Wall thickness was    normal. Systolic function was mildly to moderately reduced. The    estimated ejection fraction was 45%. There is akinesis of the    inferolateral and inferior myocardium. Doppler parameters are    consistent with abnormal left ventricular relaxation (grade 1    diastolic dysfunction).  - Mitral valve: Mildly thickened leaflets . There was trivial    regurgitation.  - Right atrium:  Central venous pressure (est): 3 mm Hg.  - Tricuspid valve: There was trivial regurgitation.  - Pulmonary arteries: Systolic pressure could not be accurately    estimated.  - Pericardium, extracardiac: There was no pericardial effusion.  Myoview on July 12, 2015: Abnormal ST segment depression noted in leads V5 and V6 toward peak heart rate in the absence of chest pain. No significant arrhythmias. Large perfusion defect in the inferior and inferolateral wall consistent with scar from previous infarct. There is partial reversibility in the lateral portion of this defect consistent with mild peri-infarct ischemia. This is an intermediate risk study. Nuclear stress EF: 41%.  Recent Labs: 01/19/2022: BUN 10; Creatinine, Ser 0.94; Hemoglobin 15.0; Platelets 297; Potassium 4.1; Sodium 139 07/28/2022: ALT 18  Recent Lipid Panel    Component Value Date/Time   CHOL 119 07/28/2022 1158   TRIG 74 07/28/2022 1158   HDL 48 07/28/2022 1158   CHOLHDL 2.5 07/28/2022 1158   CHOLHDL 5.3 11/23/2007 0350   VLDL 17 11/23/2007 0350   LDLCALC 56 07/28/2022 1158    Physical Exam:    VS:  BP (!) 142/86   Pulse 68   Ht 5\' 9"  (1.753 m)   Wt 171 lb 9.6 oz (77.8 kg)   SpO2 97%   BMI 25.34 kg/m     Wt Readings from Last 3 Encounters:  11/02/22 171 lb 9.6 oz (77.8 kg)  11/02/22 169 lb (76.7 kg)  09/07/22 166 lb (75.3 kg)    Vitals:   11/02/22 1352  BP: (!) 142/86  Pulse: 68  SpO2: 97%     GEN: Well nourished, well developed in no acute distress HEENT: Normal NECK: No JVD; No carotid bruits CARDIAC: S1/S2, RRR, no murmurs, rubs, gallops; 2+ peripheral pulses throughout, strong and equal bilaterally.  RESPIRATORY:  Clear to auscultation without rales, wheezing or rhonchi; nonproductive cough MUSCULOSKELETAL:  Mild, edema  nonpitting along BLE (L slightly greater than R). No deformity  SKIN: Warm and dry NEUROLOGIC:  Alert and oriented x 3 PSYCHIATRIC:  Normal affect   ASSESSMENT:    1.  Heart failure with mildly reduced ejection fraction (HFmrEF)   2. Ischemic cardiomyopathy   3. Leg edema   4. Medication management   5. Coronary artery disease involving native coronary artery  of native heart without angina pectoris   6. Hyperlipidemia, unspecified hyperlipidemia type   7. Hypertension, unspecified type   8. Tobacco abuse      PLAN:    In order of problems listed above:  HFmrEF, ischemic CM, leg edema, medication management TTE in 2016 revealed EF 45%, akinesis of inferolateral and inferior myocardium, due to hx of inferior wall MI in 2009. Stopping Zetia. Continue current medication regimen. Does have some mild edema along lower extremities.Wt stable, low suspicion for DVT, negative Homan's sign.  Will update Echo at this time and obtain proBNP and BMET. Discussed conservative measures for leg edema. Will write Rx for compression stockings. At next OV, if not improved, consider lower extremity doppler. Low sodium diet, fluid restriction <2L, and daily weights encouraged. Educated to contact our office for weight gain of 2 lbs overnight or 5 lbs in one week. Heart healthy diet and regular cardiovascular exercise encouraged.   CAD S/P BMS to RCA in 2009, angioplasty with DES to RCA in 2009. Stable with no anginal symptoms. No indication for ischemic evaluation. Continue current meds, will stop Zetia as he admits to cramping after starting that med. Heart healthy diet and regular cardiovascular exercise encouraged.   HLD Could not tolerate Zetia, will stop and continue pravastatin. Heart healthy diet and regular cardiovascular exercise encouraged.   HTN BP mildly up today.  Discussed SBP goal is less than 130. If BP not improved by next OV, consider starting ACE/ARB/ARNI, depending on Echo results. Discussed to monitor BP at home at least 2 hours after medications and sitting for 5-10 minutes. Heart healthy diet and regular cardiovascular exercise encouraged.   Tobacco  abuse Continues to smoke.  Encouraged and discussed smoking cessation. Care precautions discussed.   6.  Disposition: Follow-up with me or Dr. Diona Browner in 4-6 weeks or sooner if anything changes   Medication Adjustments/Labs and Tests Ordered: Current medicines are reviewed at length with the patient today.  Concerns regarding medicines are outlined above.  Orders Placed This Encounter  Procedures   Basic metabolic panel   Brain natriuretic peptide   ECHOCARDIOGRAM COMPLETE   No orders of the defined types were placed in this encounter.   Patient Instructions  Medication Instructions:  Stop Zetia (Ezetimibe) Continue all other medications.     Labwork: BNP, BMET - orders given today   Testing/Procedures: Your physician has requested that you have an echocardiogram. Echocardiography is a painless test that uses sound waves to create images of your heart. It provides your doctor with information about the size and shape of your heart and how well your heart's chambers and valves are working. This procedure takes approximately one hour. There are no restrictions for this procedure. Please do NOT wear cologne, perfume, aftershave, or lotions (deodorant is allowed). Please arrive 15 minutes prior to your appointment time.   Follow-Up: Office will contact with results via phone, letter or mychart.    4-6 weeks   Any Other Special Instructions Will Be Listed Below (If Applicable). Low compression knee high stockings - order given today   If you need a refill on your cardiac medications before your next appointment, please call your pharmacy.    Signed, Sharlene Dory, NP  11/02/2022 2:49 PM    [facility]

## 2022-11-06 ENCOUNTER — Other Ambulatory Visit: Payer: Medicare HMO

## 2022-11-06 DIAGNOSIS — I5022 Chronic systolic (congestive) heart failure: Secondary | ICD-10-CM | POA: Diagnosis not present

## 2022-11-06 DIAGNOSIS — I10 Essential (primary) hypertension: Secondary | ICD-10-CM | POA: Diagnosis not present

## 2022-11-06 DIAGNOSIS — I255 Ischemic cardiomyopathy: Secondary | ICD-10-CM | POA: Diagnosis not present

## 2022-11-06 DIAGNOSIS — Z79899 Other long term (current) drug therapy: Secondary | ICD-10-CM | POA: Diagnosis not present

## 2022-11-07 LAB — BASIC METABOLIC PANEL
CO2: 21 mmol/L (ref 20–29)
Calcium: 9.1 mg/dL (ref 8.6–10.2)
Chloride: 104 mmol/L (ref 96–106)
Creatinine, Ser: 1.03 mg/dL (ref 0.76–1.27)
eGFR: 80 mL/min/{1.73_m2} (ref >59)

## 2022-11-07 LAB — BRAIN NATRIURETIC PEPTIDE

## 2022-11-08 ENCOUNTER — Ambulatory Visit: Payer: Medicare HMO | Attending: Nurse Practitioner

## 2022-11-08 DIAGNOSIS — I255 Ischemic cardiomyopathy: Secondary | ICD-10-CM | POA: Diagnosis not present

## 2022-11-08 LAB — ECHOCARDIOGRAM COMPLETE
AR max vel: 2.77 cm2
AV Area VTI: 3.05 cm2
AV Area mean vel: 3.19 cm2
AV Mean grad: 3 mmHg
AV Peak grad: 6.8 mmHg
Ao pk vel: 1.3 m/s
Area-P 1/2: 2.56 cm2
Calc EF: 52.9 %
MV M vel: 3.17 m/s
MV Peak grad: 40.2 mmHg
S' Lateral: 3.7 cm
Single Plane A2C EF: 52.5 %
Single Plane A4C EF: 53.4 %

## 2022-11-08 LAB — BASIC METABOLIC PANEL
BUN/Creatinine Ratio: 7 — ABNORMAL LOW (ref 10–24)
BUN: 7 mg/dL — ABNORMAL LOW (ref 8–27)
Glucose: 105 mg/dL — ABNORMAL HIGH (ref 70–99)
Potassium: 4.1 mmol/L (ref 3.5–5.2)
Sodium: 140 mmol/L (ref 134–144)

## 2022-11-17 ENCOUNTER — Telehealth: Payer: Self-pay | Admitting: Internal Medicine

## 2022-11-17 NOTE — Telephone Encounter (Signed)
Patient is requesting the results of echo. Please advise

## 2022-11-17 NOTE — Telephone Encounter (Signed)
Patient made aware, verbalized understanding. See result note encounter.

## 2022-11-17 NOTE — Telephone Encounter (Signed)
Patient would like recent echo test results.

## 2022-11-24 ENCOUNTER — Telehealth: Payer: Self-pay

## 2022-11-24 NOTE — Telephone Encounter (Signed)
Patient notified and verbalized understanding. Patient had no questions or concerns at this time. PCP copied 

## 2022-11-24 NOTE — Telephone Encounter (Signed)
-----   Message from Sharlene Dory, NP sent at 11/22/2022  9:03 PM EDT ----- Labs look good.   Sharlene Dory, AGNP-C

## 2022-12-08 ENCOUNTER — Ambulatory Visit: Payer: Medicare HMO | Attending: Nurse Practitioner | Admitting: Nurse Practitioner

## 2022-12-08 ENCOUNTER — Encounter: Payer: Self-pay | Admitting: Nurse Practitioner

## 2022-12-08 VITALS — BP 158/88 | HR 61 | Ht 69.0 in | Wt 173.0 lb

## 2022-12-08 DIAGNOSIS — I5020 Unspecified systolic (congestive) heart failure: Secondary | ICD-10-CM

## 2022-12-08 DIAGNOSIS — I10 Essential (primary) hypertension: Secondary | ICD-10-CM

## 2022-12-08 DIAGNOSIS — Z720 Tobacco use: Secondary | ICD-10-CM | POA: Diagnosis not present

## 2022-12-08 DIAGNOSIS — E785 Hyperlipidemia, unspecified: Secondary | ICD-10-CM | POA: Diagnosis not present

## 2022-12-08 DIAGNOSIS — I5032 Chronic diastolic (congestive) heart failure: Secondary | ICD-10-CM

## 2022-12-08 DIAGNOSIS — I255 Ischemic cardiomyopathy: Secondary | ICD-10-CM | POA: Diagnosis not present

## 2022-12-08 DIAGNOSIS — I2510 Atherosclerotic heart disease of native coronary artery without angina pectoris: Secondary | ICD-10-CM | POA: Diagnosis not present

## 2022-12-08 MED ORDER — NITROGLYCERIN 0.4 MG SL SUBL: 0.4000 mg | SUBLINGUAL_TABLET | SUBLINGUAL | 3 refills | Status: AC | PRN

## 2022-12-08 NOTE — Patient Instructions (Signed)
Medication Instructions:   Begin Nitroglycerin as needed for chest pain  Continue all other medications.     Labwork:  none  Testing/Procedures:  none  Follow-Up:  6 months   Any Other Special Instructions Will Be Listed Below (If Applicable).  BP log - keep for 2-3 weeks & return to office for provider review [place]   If you need a refill on your cardiac medications before your next appointment, please call your pharmacy.
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
## 2022-12-25 ENCOUNTER — Encounter: Payer: Self-pay | Admitting: Nurse Practitioner

## 2023-01-31 ENCOUNTER — Encounter: Payer: Self-pay | Admitting: Family Medicine

## 2023-01-31 ENCOUNTER — Ambulatory Visit: Payer: Medicare HMO | Admitting: Family Medicine

## 2023-01-31 VITALS — BP 126/72 | HR 52 | Temp 97.0°F | Resp 20 | Ht 69.0 in | Wt 168.4 lb

## 2023-01-31 DIAGNOSIS — Z13228 Encounter for screening for other metabolic disorders: Secondary | ICD-10-CM | POA: Diagnosis not present

## 2023-01-31 DIAGNOSIS — I25119 Atherosclerotic heart disease of native coronary artery with unspecified angina pectoris: Secondary | ICD-10-CM | POA: Diagnosis not present

## 2023-01-31 DIAGNOSIS — I110 Hypertensive heart disease with heart failure: Secondary | ICD-10-CM | POA: Diagnosis not present

## 2023-01-31 DIAGNOSIS — E782 Mixed hyperlipidemia: Secondary | ICD-10-CM

## 2023-01-31 DIAGNOSIS — Z136 Encounter for screening for cardiovascular disorders: Secondary | ICD-10-CM | POA: Diagnosis not present

## 2023-01-31 DIAGNOSIS — Z130 Encounter for screening for diseases of the blood and blood-forming organs and certain disorders involving the immune mechanism: Secondary | ICD-10-CM

## 2023-01-31 DIAGNOSIS — I10 Essential (primary) hypertension: Secondary | ICD-10-CM | POA: Insufficient documentation

## 2023-01-31 DIAGNOSIS — Z0000 Encounter for general adult medical examination without abnormal findings: Secondary | ICD-10-CM

## 2023-01-31 DIAGNOSIS — Z13220 Encounter for screening for lipoid disorders: Secondary | ICD-10-CM | POA: Diagnosis not present

## 2023-01-31 DIAGNOSIS — N529 Male erectile dysfunction, unspecified: Secondary | ICD-10-CM | POA: Diagnosis not present

## 2023-01-31 DIAGNOSIS — Z1329 Encounter for screening for other suspected endocrine disorder: Secondary | ICD-10-CM | POA: Diagnosis not present

## 2023-01-31 DIAGNOSIS — R748 Abnormal levels of other serum enzymes: Secondary | ICD-10-CM | POA: Diagnosis not present

## 2023-01-31 DIAGNOSIS — I5032 Chronic diastolic (congestive) heart failure: Secondary | ICD-10-CM

## 2023-01-31 DIAGNOSIS — Z0001 Encounter for general adult medical examination with abnormal findings: Secondary | ICD-10-CM

## 2023-01-31 MED ORDER — SILDENAFIL CITRATE 50 MG PO TABS
50.0000 mg | ORAL_TABLET | Freq: Every day | ORAL | 10 refills | Status: DC | PRN
Start: 2023-01-31 — End: 2024-03-06

## 2023-01-31 NOTE — Progress Notes (Signed)
Complete physical exam  Patient: [name]   DOB: Jan 29, 1956   67 y.o. Male  MRN: [id number]  Subjective:    Chief Complaint  Patient presents with   Annual Exam    [name] is a 67 y.o. male who presents today for a complete physical exam. He reports consuming a general diet. The patient has a physically strenuous job, but has no regular exercise apart from work.  He generally feels well. He reports sleeping well. He does have additional problems to discuss today.   BP at home is 120s/70s. He checks it regularly. Denies chest pain, shortness of breath, edema, orthopnea. He recently had a good follow up with cardiology. Sees cardiology every 6 months.    He has recently started a relationship. He reports difficulty achieving and maintaining an erection. He has never tried medication for treatment but is interested in trying something.   Most recent fall risk assessment:    11/02/2022    8:52 AM  Fall Risk   Falls in the past year? 0  Number falls in past yr: 0  Injury with Fall? 0  Risk for fall due to : No Fall Risks  Follow up Falls prevention discussed     Most recent depression screenings:    11/02/2022    8:53 AM 09/07/2022    8:22 AM  PHQ 2/9 Scores  PHQ - 2 Score 0 0  PHQ- 9 Score 0 0    Vision:Not within last year  and Dental: No current dental problems and No regular dental care   Past Medical History:  Diagnosis Date   CAD (coronary artery disease)    BMS to RCA May 2009, cutting balloon angioplasty for restenosis July 2009, then DES to RCA  August 2009.   History of inferior wall myocardial infarction 11/2007   Hyperlipidemia    IBS (irritable bowel syndrome)    Loose body of right knee 08/2017   Medial meniscus tear 08/2017   right knee   MYOCARDIAL INFARCTION, INFERIOR WALL 11/27/2008   Qualifier: Diagnosis of  By: [name] CMA, [place]     Wears dentures    upper      Patient Care Team: Billy Earing, FNP as PCP - General (Family  Medicine) Billy Sidle, MD as PCP - Cardiology (Cardiology)   Outpatient Medications Prior to Visit  Medication Sig   aspirin EC (EQ ASPIRIN ADULT LOW DOSE) 81 MG tablet Take 1 tablet (81 mg total) by mouth daily.   metoprolol succinate (TOPROL-XL) 50 MG 24 hr tablet Take 1 tablet (50 mg total) by mouth daily. Take with or immediately following a meal.   nitroGLYCERIN (NITROSTAT) 0.4 MG SL tablet Place 1 tablet (0.4 mg total) under the tongue every 5 (five) minutes as needed for chest pain.   pravastatin (PRAVACHOL) 40 MG tablet Take 1 tablet (40 mg total) by mouth every evening.   albuterol (VENTOLIN HFA) 108 (90 Base) MCG/ACT inhaler Inhale 2 puffs into the lungs every 6 (six) hours as needed for wheezing or shortness of breath. (Patient not taking: Reported on 01/31/2023)   Budeson-Glycopyrrol-Formoterol (BREZTRI AEROSPHERE) 160-9-4.8 MCG/ACT AERO Inhale into the lungs as needed. (Patient not taking: Reported on 01/31/2023)   levocetirizine (XYZAL) 5 MG tablet Take 1 tablet (5 mg total) by mouth at bedtime as needed for allergies (runny nose). (Patient not taking: Reported on 01/31/2023)   No facility-administered medications prior to visit.    ROS Negative unless specially indicated above in  HPI.     Objective:     BP 126/72 Comment: home reading per patient  Pulse (!) 52   Temp (!) 97 F (36.1 C) (Oral)   Resp 20   Ht 5\' 9"  (1.753 m)   Wt 168 lb 6 oz (76.4 kg)   SpO2 95%   BMI 24.86 kg/m  BP Readings from Last 3 Encounters:  01/31/23 (!) 152/85  12/08/22 (!) 158/88  11/02/22 (!) 142/86      Physical Exam Vitals and nursing note reviewed.  Constitutional:      General: He is not in acute distress.    Appearance: He is not ill-appearing.  HENT:     Head: Normocephalic.     Right Ear: Tympanic membrane, ear canal and external ear normal.     Left Ear: Tympanic membrane, ear canal and external ear normal.     Nose: Nose normal.     Mouth/Throat:     Mouth:  Mucous membranes are moist.     Pharynx: Oropharynx is clear.  Eyes:     Extraocular Movements: Extraocular movements intact.     Conjunctiva/sclera: Conjunctivae normal.     Pupils: Pupils are equal, round, and reactive to light.  Neck:     Thyroid: No thyroid mass, thyromegaly or thyroid tenderness.     Vascular: No carotid bruit.  Cardiovascular:     Rate and Rhythm: Normal rate and regular rhythm.     Pulses: Normal pulses.     Heart sounds: Normal heart sounds. No murmur heard.    No friction rub. No gallop.  Pulmonary:     Effort: Pulmonary effort is normal.     Breath sounds: Normal breath sounds.  Abdominal:     General: Bowel sounds are normal. There is no distension.     Palpations: Abdomen is soft. There is no mass.     Tenderness: There is no abdominal tenderness. There is no guarding.  Musculoskeletal:        General: No swelling. Normal range of motion.     Cervical back: Normal range of motion and neck supple. No tenderness.     Right lower leg: No edema.     Left lower leg: No edema.  Skin:    General: Skin is warm and dry.     Capillary Refill: Capillary refill takes less than 2 seconds.     Findings: No lesion or rash.  Neurological:     General: No focal deficit present.     Mental Status: He is alert and oriented to person, place, and time.     Cranial Nerves: No cranial nerve deficit.     Motor: No weakness.     Coordination: Coordination normal.     Gait: Gait normal.  Psychiatric:        Mood and Affect: Mood normal.        Behavior: Behavior normal.        Thought Content: Thought content normal.      No results found for any visits on 01/31/23.     Assessment & Plan:    Routine Health Maintenance and Physical Exam  [name] was seen today for annual exam.  Diagnoses and all orders for this visit:  Routine general medical examination at a health care facility  Primary hypertension Well controlled on current regimen.   Mixed  hyperlipidemia On pravastatin. Didn't tolerate zetia. Fasting lipid panel pending.   Heart failure with improved ejection fraction (HFimpEF) (HCC) Coronary artery disease involving  native coronary artery of native heart with angina pectoris (HCC) Euvolemic. Well controlled. Managed by cardiology.   Erectile dysfunction, unspecified erectile dysfunction type -     sildenafil (VIAGRA) 50 MG tablet; Take 1 tablet (50 mg total) by mouth daily as needed for erectile dysfunction.  Screening for endocrine, metabolic and immunity disorder -     TSH -     CBC with Differential/Platelet -     CMP14+EGFR  Encounter for screening for lipid disorder -     Lipid panel    Immunization History  Administered Date(s) Administered   Influenza,inj,Quad PF,6+ Mos 04/17/2019   PFIZER(Purple Top)SARS-COV-2 Vaccination 10/30/2019, 11/21/2019, 05/22/2020   PNEUMOCOCCAL CONJUGATE-20 10/06/2021   Pneumococcal Conjugate-13 12/21/2015   Tdap 12/20/2013, 08/24/2021   Zoster Recombinant(Shingrix) 08/24/2021, 11/12/2021    Health Maintenance  Topic Date Due   COVID-19 Vaccine (4 - 2023-24 season) 03/24/2022   INFLUENZA VACCINE  02/22/2023   Medicare Annual Wellness (AWV)  11/02/2023   Colonoscopy  01/17/2024   DTaP/Tdap/Td (3 - Td or Tdap) 08/25/2031   Pneumonia Vaccine 46+ Years old  Completed   Hepatitis C Screening  Completed   Zoster Vaccines- Shingrix  Completed   HPV VACCINES  Aged Out    Discussed health benefits of physical activity, and encouraged him to engage in regular exercise appropriate for his age and condition.  Problem List Items Addressed This Visit       Cardiovascular and Mediastinum   Coronary artery disease involving native coronary artery of native heart with angina pectoris (HCC)   Relevant Medications   sildenafil (VIAGRA) 50 MG tablet   Primary hypertension   Relevant Medications   sildenafil (VIAGRA) 50 MG tablet   Heart failure with improved ejection fraction  (HFimpEF) (HCC)   Relevant Medications   sildenafil (VIAGRA) 50 MG tablet     Other   Mixed hyperlipidemia   Relevant Medications   sildenafil (VIAGRA) 50 MG tablet   Other Visit Diagnoses     Routine general medical examination at a health care facility    -  Primary   Erectile dysfunction, unspecified erectile dysfunction type       Relevant Medications   sildenafil (VIAGRA) 50 MG tablet   Screening for endocrine, metabolic and immunity disorder       Encounter for screening for lipid disorder          Return in about 1 year (around 01/31/2024) for CPE.   The patient indicates understanding of these issues and agrees with the plan.  Billy Earing, FNP

## 2023-01-31 NOTE — Patient Instructions (Signed)
Health Maintenance, Male Adopting a healthy lifestyle and getting preventive care are important in promoting health and wellness. Ask your health care provider about: The right schedule for you to have regular tests and exams. Things you can do on your own to prevent diseases and keep yourself healthy. What should I know about diet, weight, and exercise? Eat a healthy diet  Eat a diet that includes plenty of vegetables, fruits, low-fat dairy products, and lean protein. Do not eat a lot of foods that are high in solid fats, added sugars, or sodium. Maintain a healthy weight Body mass index (BMI) is a measurement that can be used to identify possible weight problems. It estimates body fat based on height and weight. Your health care provider can help determine your BMI and help you achieve or maintain a healthy weight. Get regular exercise Get regular exercise. This is one of the most important things you can do for your health. Most adults should: Exercise for at least 150 minutes each week. The exercise should increase your heart rate and make you sweat (moderate-intensity exercise). Do strengthening exercises at least twice a week. This is in addition to the moderate-intensity exercise. Spend less time sitting. Even light physical activity can be beneficial. Watch cholesterol and blood lipids Have your blood tested for lipids and cholesterol at 66 years of age, then have this test every 5 years. You may need to have your cholesterol levels checked more often if: Your lipid or cholesterol levels are high. You are older than 66 years of age. You are at high risk for heart disease. What should I know about cancer screening? Many types of cancers can be detected early and may often be prevented. Depending on your health history and family history, you may need to have cancer screening at various ages. This may include screening for: Colorectal cancer. Prostate cancer. Skin cancer. Lung  cancer. What should I know about heart disease, diabetes, and high blood pressure? Blood pressure and heart disease High blood pressure causes heart disease and increases the risk of stroke. This is more likely to develop in people who have high blood pressure readings or are overweight. Talk with your health care provider about your target blood pressure readings. Have your blood pressure checked: Every 3-5 years if you are 18-39 years of age. Every year if you are 40 years old or older. If you are between the ages of 65 and 75 and are a current or former smoker, ask your health care provider if you should have a one-time screening for abdominal aortic aneurysm (AAA). Diabetes Have regular diabetes screenings. This checks your fasting blood sugar level. Have the screening done: Once every three years after age 45 if you are at a normal weight and have a low risk for diabetes. More often and at a younger age if you are overweight or have a high risk for diabetes. What should I know about preventing infection? Hepatitis B If you have a higher risk for hepatitis B, you should be screened for this virus. Talk with your health care provider to find out if you are at risk for hepatitis B infection. Hepatitis C Blood testing is recommended for: Everyone born from 1945 through 1965. Anyone with known risk factors for hepatitis C. Sexually transmitted infections (STIs) You should be screened each year for STIs, including gonorrhea and chlamydia, if: You are sexually active and are younger than 66 years of age. You are older than 66 years of age and your   health care provider tells you that you are at risk for this type of infection. Your sexual activity has changed since you were last screened, and you are at increased risk for chlamydia or gonorrhea. Ask your health care provider if you are at risk. Ask your health care provider about whether you are at high risk for HIV. Your health care provider  may recommend a prescription medicine to help prevent HIV infection. If you choose to take medicine to prevent HIV, you should first get tested for HIV. You should then be tested every 3 months for as long as you are taking the medicine. Follow these instructions at home: Alcohol use Do not drink alcohol if your health care provider tells you not to drink. If you drink alcohol: Limit how much you have to 0-2 drinks a day. Know how much alcohol is in your drink. In the [place] one drink equals one 12 oz bottle of beer (355 mL), one 5 oz glass of wine (148 mL), or one 1 oz glass of hard liquor (44 mL). Lifestyle Do not use any products that contain nicotine or tobacco. These products include cigarettes, chewing tobacco, and vaping devices, such as e-cigarettes. If you need help quitting, ask your health care provider. Do not use street drugs. Do not share needles. Ask your health care provider for help if you need support or information about quitting drugs. General instructions Schedule regular health, dental, and eye exams. Stay current with your vaccines. Tell your health care provider if: You often feel depressed. You have ever been abused or do not feel safe at home. Summary Adopting a healthy lifestyle and getting preventive care are important in promoting health and wellness. Follow your health care provider's instructions about healthy diet, exercising, and getting tested or screened for diseases. Follow your health care provider's instructions on monitoring your cholesterol and blood pressure. This information is not intended to replace advice given to you by your health care provider. Make sure you discuss any questions you have with your health care provider. Document Revised: 11/29/2020 Document Reviewed: 11/29/2020 Elsevier Patient Education  2024 [employer]  

## 2023-02-01 ENCOUNTER — Other Ambulatory Visit: Payer: Self-pay

## 2023-02-01 ENCOUNTER — Other Ambulatory Visit: Payer: Self-pay | Admitting: Family Medicine

## 2023-02-01 DIAGNOSIS — R748 Abnormal levels of other serum enzymes: Secondary | ICD-10-CM

## 2023-02-01 DIAGNOSIS — I25119 Atherosclerotic heart disease of native coronary artery with unspecified angina pectoris: Secondary | ICD-10-CM

## 2023-02-01 DIAGNOSIS — E785 Hyperlipidemia, unspecified: Secondary | ICD-10-CM

## 2023-02-01 LAB — CMP14+EGFR
ALT: 12 IU/L (ref 0–44)
AST: 19 IU/L (ref 0–40)
Albumin: 4.1 g/dL (ref 3.9–4.9)
Alkaline Phosphatase: 134 IU/L — ABNORMAL HIGH (ref 44–121)
BUN/Creatinine Ratio: 7 — ABNORMAL LOW (ref 10–24)
BUN: 6 mg/dL — ABNORMAL LOW (ref 8–27)
Bilirubin Total: 0.4 mg/dL (ref 0.0–1.2)
CO2: 21 mmol/L (ref 20–29)
Calcium: 9.2 mg/dL (ref 8.6–10.2)
Chloride: 103 mmol/L (ref 96–106)
Creatinine, Ser: 0.86 mg/dL (ref 0.76–1.27)
Globulin, Total: 2.2 g/dL (ref 1.5–4.5)
Glucose: 88 mg/dL (ref 70–99)
Potassium: 4.1 mmol/L (ref 3.5–5.2)
Sodium: 139 mmol/L (ref 134–144)
Total Protein: 6.3 g/dL (ref 6.0–8.5)
eGFR: 95 mL/min/{1.73_m2} (ref >59)

## 2023-02-01 LAB — LIPID PANEL
Chol/HDL Ratio: 3.8 ratio (ref 0.0–5.0)
Cholesterol, Total: 138 mg/dL (ref 100–199)
HDL: 36 mg/dL — ABNORMAL LOW (ref >39)
LDL Chol Calc (NIH): 85 mg/dL (ref 0–99)
Triglycerides: 90 mg/dL (ref 0–149)
VLDL Cholesterol Cal: 17 mg/dL (ref 5–40)

## 2023-02-01 LAB — CBC WITH DIFFERENTIAL/PLATELET
Basophils Absolute: 0.1 10*3/uL (ref 0.0–0.2)
Basos: 1 %
EOS (ABSOLUTE): 0.3 10*3/uL (ref 0.0–0.4)
Eos: 3 %
Hematocrit: 45 % (ref 37.5–51.0)
Hemoglobin: 14.4 g/dL (ref 13.0–17.7)
Immature Grans (Abs): 0 10*3/uL (ref 0.0–0.1)
Immature Granulocytes: 0 %
Lymphocytes Absolute: 4 10*3/uL — ABNORMAL HIGH (ref 0.7–3.1)
Lymphs: 44 %
MCH: 29.8 pg (ref 26.6–33.0)
MCHC: 32 g/dL (ref 31.5–35.7)
MCV: 93 fL (ref 79–97)
Monocytes Absolute: 0.7 10*3/uL (ref 0.1–0.9)
Monocytes: 8 %
Neutrophils Absolute: 4 10*3/uL (ref 1.4–7.0)
Neutrophils: 44 %
Platelets: 229 10*3/uL (ref 150–450)
RBC: 4.84 x10E6/uL (ref 4.14–5.80)
RDW: 13.8 % (ref 11.6–15.4)
WBC: 9.1 10*3/uL (ref 3.4–10.8)

## 2023-02-01 LAB — TSH: TSH: 3.18 u[IU]/mL (ref 0.450–4.500)

## 2023-02-06 LAB — ALKALINE PHOSPHATASE, ISOENZYMES
Alkaline Phosphatase: 137 IU/L — ABNORMAL HIGH (ref 44–121)
BONE FRACTION: 51 % (ref 12–68)
INTESTINAL FRAC.: 4 % (ref 0–18)
LIVER FRACTION: 45 % (ref 13–88)

## 2023-02-06 LAB — SPECIMEN STATUS REPORT

## 2023-02-26 DIAGNOSIS — H52223 Regular astigmatism, bilateral: Secondary | ICD-10-CM | POA: Diagnosis not present

## 2023-02-26 DIAGNOSIS — H524 Presbyopia: Secondary | ICD-10-CM | POA: Diagnosis not present

## 2023-02-26 DIAGNOSIS — H2513 Age-related nuclear cataract, bilateral: Secondary | ICD-10-CM | POA: Diagnosis not present

## 2023-02-26 DIAGNOSIS — H5203 Hypermetropia, bilateral: Secondary | ICD-10-CM | POA: Diagnosis not present

## 2023-04-11 ENCOUNTER — Encounter: Payer: Self-pay | Admitting: Family Medicine

## 2023-04-11 ENCOUNTER — Ambulatory Visit: Payer: Medicare HMO | Admitting: Family Medicine

## 2023-04-11 VITALS — BP 135/66 | HR 63 | Temp 98.0°F | Ht 69.0 in | Wt 169.0 lb

## 2023-04-11 DIAGNOSIS — T148XXA Other injury of unspecified body region, initial encounter: Secondary | ICD-10-CM

## 2023-04-11 DIAGNOSIS — M546 Pain in thoracic spine: Secondary | ICD-10-CM | POA: Diagnosis not present

## 2023-04-11 MED ORDER — MELOXICAM 15 MG PO TABS
15.0000 mg | ORAL_TABLET | Freq: Every day | ORAL | 0 refills | Status: DC
Start: 2023-04-11 — End: 2023-06-18

## 2023-04-11 MED ORDER — METHOCARBAMOL 500 MG PO TABS
500.0000 mg | ORAL_TABLET | Freq: Three times a day (TID) | ORAL | 1 refills | Status: DC | PRN
Start: 2023-04-11 — End: 2023-06-18

## 2023-04-11 NOTE — Patient Instructions (Signed)
Muscle Strain A muscle strain is an injury that occurs when a muscle is stretched beyond its normal length. Usually, a small number of muscle fibers are torn when this happens. There are three types of muscle strains. First-degree strains have the least amount of muscle fiber tearing and the least amount of pain. Second-degree and third-degree strains have more tearing and pain. Usually, recovery from muscle strain takes 1-2 weeks. Complete healing normally takes 5-6 weeks. What are the causes? This condition is caused when a sudden, violent force is placed on a muscle and stretches it too far. This may occur with a fall, while lifting, or during sports. What increases the risk? This condition is more likely to develop in athletes and people who are physically active. What are the signs or symptoms? Symptoms of this condition include: Pain. Tenderness. Bruising. Swelling. Trouble using the muscle. How is this diagnosed? This condition is diagnosed based on a physical exam and your medical history. Tests may also be done, including an X-ray, ultrasound, or MRI. How is this treated? This condition is initially treated with PRICE therapy. This therapy involves: Protecting the muscle from being injured again. Resting the injured muscle. Icing the injured muscle. Applying pressure (compression) to the injured muscle. This may be done with a splint or elastic bandage. Raising (elevating) the injured muscle. Your health care provider may also recommend medicine for pain. Follow these instructions at home: If you have a removable splint: Wear the splint as told by your health care provider. Remove it only as told by your health care provider. Check the skin around the splint every day. Tell your health care provider about any concerns. Loosen the splint if your fingers or toes tingle, become numb, or turn cold and blue. Keep the splint clean. If the splint is not waterproof: Do not let it get  wet. Cover it with a watertight covering when you take a bath or a shower. Managing pain, stiffness, and swelling  If directed, put ice on the injured area. To do this: If you have a removable splint, remove it as told by your health care provider. Put ice in a plastic bag. Place a towel between your skin and the bag. Leave the ice on for 20 minutes, 2-3 times a day. Remove the ice if your skin turns bright red. This is very important. If you cannot feel pain, heat, or cold, you have a greater risk of damage to the area. Move your fingers or toes often to reduce stiffness and swelling. Raise (elevate) the injured area above the level of your heart while you are sitting or lying down. Wear an elastic bandage as told by your health care provider. Make sure that it is not too tight. General instructions Take over-the-counter and prescription medicines only as told by your health care provider. Treatment may include muscle relaxants or medicines for pain and inflammation that are taken by mouth or applied to the skin. Restrict your activity and rest the injured muscle as told by your health care provider. Gentle movements may be allowed. If physical therapy was prescribed, do exercises as told by your health care provider. Do not put pressure on any part of the splint until it is fully hardened. This may take several hours. Do not use any products that contain nicotine or tobacco. These products include cigarettes, chewing tobacco, and vaping devices, such as e-cigarettes. If you need help quitting, ask your health care provider. Ask your health care provider when it is  safe to drive if you have a splint. Keep all follow-up visits. This is important. How is this prevented? Warm up before exercising. This helps to prevent future muscle strains. Contact a health care provider if: You have more pain or swelling in the injured area. Get help right away if: You have numbness or tingling in the  injured area. You lose a lot of strength in the injured area. Summary A muscle strain is an injury that occurs when a muscle is stretched beyond its normal length. This condition is caused when a sudden, violent force is placed on a muscle and stretches it too far. This condition is initially treated with PRICE therapy, which involves protecting, resting, icing, compressing, and elevating. Gentle movements may be allowed. If physical therapy was prescribed, do exercises as told by your health care provider. This information is not intended to replace advice given to you by your health care provider. Make sure you discuss any questions you have with your health care provider. Document Revised: 11/13/2022 Document Reviewed: 09/27/2020 Elsevier Patient Education  2024 [employer].

## 2023-04-11 NOTE — Progress Notes (Signed)
Acute Office Visit  Subjective:     Patient ID: [name], male    DOB: 11/10/1955, 67 y.o.   MRN: [id number]  Chief Complaint  Patient presents with   Back Pain    Back Pain This is a new problem. The current episode started in the past 7 days. The problem occurs intermittently. The problem has been gradually worsening since onset. The pain is present in the thoracic spine. The quality of the pain is described as stabbing. Radiates to: right rib cage. The symptoms are aggravated by position and twisting. Pertinent negatives include no abdominal pain, bladder incontinence, bowel incontinence, chest pain, dysuria, fever, numbness, paresis, paresthesias, pelvic pain, perianal numbness, tingling or weakness. He has tried heat, ice and NSAIDs for the symptoms. The treatment provided no relief.   No injury. 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 Denies chest pain, shortness of breath, edema, dizziness.    Review of Systems  Constitutional:  Negative for fever.  Cardiovascular:  Negative for chest pain.  Gastrointestinal:  Negative for abdominal pain and bowel incontinence.  Genitourinary:  Negative for bladder incontinence, dysuria and pelvic pain.  Musculoskeletal:  Positive for back pain.  Neurological:  Negative for tingling, weakness, numbness and paresthesias.        Objective:    BP 135/66   Pulse 63   Temp 98 F (36.7 C) (Temporal)   Ht 5\' 9"  (1.753 m)   Wt 169 lb (76.7 kg)   SpO2 97%   BMI 24.96 kg/m  BP Readings from Last 3 Encounters:  04/11/23 135/66  01/31/23 126/72  12/08/22 (!) 158/88      Physical Exam Vitals and nursing note reviewed.  Constitutional:      General: He is not in acute distress.    Appearance: Normal appearance. He is not ill-appearing, toxic-appearing or diaphoretic.  Pulmonary:     Effort: Pulmonary effort is normal. No respiratory distress.     Breath sounds: No wheezing or rhonchi.  Chest:     Chest wall: No  tenderness.  Musculoskeletal:     Thoracic back: Tenderness (area marked as below) present. No swelling, edema, deformity, signs of trauma, lacerations, spasms or bony tenderness. Normal range of motion.       Back:  Skin:    General: Skin is warm and dry.  Neurological:     General: No focal deficit present.     Mental Status: He is alert and oriented to person, place, and time.  Psychiatric:        Mood and Affect: Mood normal.        Behavior: Behavior normal.     No results found for any visits on 04/11/23.      Assessment & Plan:   Rita was seen today for back pain.  Diagnoses and all orders for this visit:  Muscle strain Mobic as below. Do not take other NSAIDs with this. Robaxin prn. Heat, rest. Return to office for new or worsening symptoms, or if symptoms persist.  -     meloxicam (MOBIC) 15 MG tablet; Take 1 tablet (15 mg total) by mouth daily. -     methocarbamol (ROBAXIN) 500 MG tablet; Take 1 tablet (500 mg total) by mouth every 8 (eight) hours as needed for muscle spasms.  The patient indicates understanding of these issues and agrees with the plan.  Gabriel Earing, FNP

## 2023-04-13 DIAGNOSIS — R0781 Pleurodynia: Secondary | ICD-10-CM | POA: Diagnosis not present

## 2023-04-13 DIAGNOSIS — S29019A Strain of muscle and tendon of unspecified wall of thorax, initial encounter: Secondary | ICD-10-CM | POA: Diagnosis not present

## 2023-04-13 DIAGNOSIS — S39012A Strain of muscle, fascia and tendon of lower back, initial encounter: Secondary | ICD-10-CM | POA: Diagnosis not present

## 2023-04-13 DIAGNOSIS — M25511 Pain in right shoulder: Secondary | ICD-10-CM | POA: Diagnosis not present

## 2023-04-13 DIAGNOSIS — X58XXXA Exposure to other specified factors, initial encounter: Secondary | ICD-10-CM | POA: Diagnosis not present

## 2023-05-06 ENCOUNTER — Other Ambulatory Visit: Payer: Self-pay | Admitting: Family Medicine

## 2023-05-06 DIAGNOSIS — E785 Hyperlipidemia, unspecified: Secondary | ICD-10-CM

## 2023-05-06 DIAGNOSIS — I25119 Atherosclerotic heart disease of native coronary artery with unspecified angina pectoris: Secondary | ICD-10-CM

## 2023-06-18 ENCOUNTER — Encounter: Payer: Self-pay | Admitting: Cardiology

## 2023-06-18 ENCOUNTER — Ambulatory Visit: Payer: Medicare HMO | Attending: Cardiology | Admitting: Cardiology

## 2023-06-18 VITALS — BP 150/80 | HR 59 | Ht 69.0 in | Wt 171.0 lb

## 2023-06-18 DIAGNOSIS — I255 Ischemic cardiomyopathy: Secondary | ICD-10-CM | POA: Diagnosis not present

## 2023-06-18 DIAGNOSIS — I25119 Atherosclerotic heart disease of native coronary artery with unspecified angina pectoris: Secondary | ICD-10-CM

## 2023-06-18 DIAGNOSIS — I10 Essential (primary) hypertension: Secondary | ICD-10-CM | POA: Diagnosis not present

## 2023-06-18 DIAGNOSIS — E782 Mixed hyperlipidemia: Secondary | ICD-10-CM | POA: Diagnosis not present

## 2023-06-18 MED ORDER — PRAVASTATIN SODIUM 80 MG PO TABS: 80.0000 mg | ORAL_TABLET | Freq: Every evening | ORAL | 2 refills | Status: DC

## 2023-06-18 NOTE — Progress Notes (Signed)
Cardiology Office Note  Date: 06/18/2023   ID: [name], DOB 1956-06-03, MRN [id number]  History of Present Illness: [name] is a 67 y.o. male last seen in May by Ms. Peck NP, I reviewed the note (I have not seen him since 2019).  He is here for a routine visit.  Reports no angina or nitroglycerin use in the interim, NYHA class II dyspnea.  No palpitations or syncope.  Still working as a IT sales professional.  I reviewed his medications.  Current cardiac regimen includes aspirin, Toprol-XL, Pravachol, and as needed nitroglycerin.  We discussed uptitrating his statin, last LDL was 85 in July of this year.  I reviewed his ECG today which shows sinus bradycardia with old inferior infarct pattern.  He had a follow-up echocardiogram in April of this year at which point LVEF was 50 to 55%, improved compared to prior testing.  Physical Exam: VS:  BP (!) 150/80 (BP Location: Left Arm, Cuff Size: Normal)   Pulse (!) 59   Ht 5\' 9"  (1.753 m)   Wt 171 lb (77.6 kg)   SpO2 98%   BMI 25.25 kg/m , BMI Body mass index is 25.25 kg/m.  Wt Readings from Last 3 Encounters:  06/18/23 171 lb (77.6 kg)  04/11/23 169 lb (76.7 kg)  01/31/23 168 lb 6 oz (76.4 kg)    General: Patient appears comfortable at rest. HEENT: Conjunctiva and lids normal. Neck: Supple, no elevated JVP or carotid bruits. Lungs: Clear to auscultation, nonlabored breathing at rest. Cardiac: Regular rate and rhythm, no S3 or significant systolic murmur. Extremities: No pitting edema.  ECG:  An ECG dated 06/13/2022 was personally reviewed today and demonstrated:  Sinus rhythm with old inferior infarct pattern and nonspecific ST changes.  Labwork: 11/06/2022: BNP 38.8 01/31/2023: ALT 12; AST 19; BUN 6; Creatinine, Ser 0.86; Hemoglobin 14.4; Platelets 229; Potassium 4.1; Sodium 139; TSH 3.180     Component Value Date/Time   CHOL 138 01/31/2023 1108   TRIG 90 01/31/2023 1108   HDL 36 (L) 01/31/2023 1108   CHOLHDL 3.8 01/31/2023  1108   CHOLHDL 5.3 11/23/2007 0350   VLDL 17 11/23/2007 0350   LDLCALC 85 01/31/2023 1108   Other Studies Reviewed Today:  Echocardiogram 11/08/2022:  1. Left ventricular ejection fraction, by estimation, is 50 to 55%. The  left ventricle has low normal function. The left ventricle demonstrates  regional wall motion abnormalities, the entire inferior and posterior wall  are akinetic. The inferior wall is   echobright consistent with scar. Left ventricular diastolic parameters  were normal. The average left ventricular global longitudinal strain is  -23.2 %. The global longitudinal strain is normal.   2. Right ventricular systolic function is normal. The right ventricular  size is normal.   3. The mitral valve is normal in structure. No evidence of mitral valve  regurgitation. No evidence of mitral stenosis.   4. The aortic valve is tricuspid. Aortic valve regurgitation is not  visualized. No aortic stenosis is present.   5. The inferior vena cava is normal in size with greater than 50%  respiratory variability, suggesting right atrial pressure of 3 mmHg.   Assessment and Plan:  1.  CAD status post inferior STEMI with BMS to the RCA in 2009 followed by [place] angioplasty for restenosis and ultimately DES that same year.  He is symptomatically stable without active angina or nitroglycerin use.  LVEF improved by last assessment in April of this year.  Continue aspirin,  Toprol-XL, Pravachol, and as needed nitroglycerin.  2.  HFrecEF with history of ischemic cardiomyopathy, LVEF 50 to 55% by echocardiogram in April of this year.  3.  Mixed hyperlipidemia.  LDL 85 in July of this year.  Increase Pravachol to 80 mg daily.  He has a history of intolerance to Zetia per chart review.  4.  Primary hypertension.  Also suspect whitecoat hypertension.  States that he does check blood pressure at home with systolics generally no higher than the 130s.  I asked him to keep an eye on this.  May  need to consider adding low-dose ARB.  Disposition:  Follow up  6 months.  Signed, Jonelle Sidle, M.D., F.A.C.C. [facility]

## 2023-06-18 NOTE — Patient Instructions (Addendum)
Medication Instructions:  Your physician has recommended you make the following change in your medication:  Increase pravastatin to 80 mg daily Continue all other medications as prescribed  Labwork: none  Testing/Procedures: none  Follow-Up: Your physician recommends that you schedule a follow-up appointment in: 6 months  Any Other Special Instructions Will Be Listed Below (If Applicable).  If you need a refill on your cardiac medications before your next appointment, please call your pharmacy.

## 2023-08-14 ENCOUNTER — Telehealth: Payer: Self-pay | Admitting: Cardiology

## 2023-08-14 MED ORDER — METOPROLOL SUCCINATE ER 50 MG PO TB24: 50.0000 mg | ORAL_TABLET | Freq: Every day | ORAL | 1 refills | Status: DC

## 2023-08-14 NOTE — Telephone Encounter (Signed)
*  STAT* If patient is at the pharmacy, call can be transferred to refill team.   1. Which medications need to be refilled? (please list name of each medication and dose if known)   metoprolol succinate (TOPROL-XL) 50 MG 24 hr tablet (Expired)    2. Which pharmacy/location (including street and city if local pharmacy) is medication to be sent to? [facility] [address], [place] - [place] [address] [place] [address] Phone: [phone]  Fax: [phone]     3. Do they need a 30 day or 90 day supply? 90

## 2023-08-14 NOTE — Telephone Encounter (Signed)
Filled

## 2023-09-17 ENCOUNTER — Encounter: Payer: Self-pay | Admitting: *Deleted

## 2023-11-05 ENCOUNTER — Ambulatory Visit: Payer: Medicare HMO

## 2023-11-05 VITALS — Ht 69.0 in | Wt 171.0 lb

## 2023-11-05 DIAGNOSIS — Z0000 Encounter for general adult medical examination without abnormal findings: Secondary | ICD-10-CM | POA: Diagnosis not present

## 2023-11-05 NOTE — Progress Notes (Signed)
 Subjective:   [name] is a 68 y.o. who presents for a Medicare Wellness preventive visit.  Visit Complete: Virtual I connected with  [name] on 11/05/23 by a audio enabled telemedicine application and verified that I am speaking with the correct person using two identifiers.  Patient Location: Home  Provider Location: Home Office  I discussed the limitations of evaluation and management by telemedicine. The patient expressed understanding and agreed to proceed.  Vital Signs: Because this visit was a virtual/telehealth visit, some criteria may be missing or patient reported. Any vitals not documented were not able to be obtained and vitals that have been documented are patient reported.  VideoDeclined- This patient declined Librarian, academic. Therefore the visit was completed with audio only.  Persons Participating in Visit: Patient.  AWV Questionnaire: No: Patient Medicare AWV questionnaire was not completed prior to this visit.  Cardiac Risk Factors include: advanced age (>58men, >51 women);male gender;smoking/ tobacco exposure;hypertension;dyslipidemia     Objective:    Today's Vitals   11/05/23 0912  Weight: 171 lb (77.6 kg)  Height: 5\' 9"  (1.753 m)   Body mass index is 25.25 kg/m.     11/05/2023    9:31 AM 11/02/2022    8:54 AM 09/13/2017   10:20 AM 09/07/2017   11:50 AM  Advanced Directives  Does Patient Have a Medical Advance Directive? No No No No  Would patient like information on creating a medical advance directive? Yes (MAU/Ambulatory/Procedural Areas - Information given) No - Patient declined No - Patient declined No - Patient declined    Current Medications (verified) Outpatient Encounter Medications as of 11/05/2023  Medication Sig   albuterol (VENTOLIN HFA) 108 (90 Base) MCG/ACT inhaler Inhale 2 puffs into the lungs every 6 (six) hours as needed for wheezing or shortness of breath.   aspirin EC (EQ ASPIRIN ADULT LOW  DOSE) 81 MG tablet Take 1 tablet (81 mg total) by mouth daily.   Budeson-Glycopyrrol-Formoterol (BREZTRI AEROSPHERE) 160-9-4.8 MCG/ACT AERO Inhale into the lungs as needed.   metoprolol succinate (TOPROL-XL) 50 MG 24 hr tablet Take 1 tablet (50 mg total) by mouth daily. Take with or immediately following a meal.   nitroGLYCERIN (NITROSTAT) 0.4 MG SL tablet Place 1 tablet (0.4 mg total) under the tongue every 5 (five) minutes as needed for chest pain.   pravastatin (PRAVACHOL) 80 MG tablet Take 1 tablet (80 mg total) by mouth every evening.   sildenafil (VIAGRA) 50 MG tablet Take 1 tablet (50 mg total) by mouth daily as needed for erectile dysfunction.   No facility-administered encounter medications on file as of 11/05/2023.    Allergies (verified) Zetia [ezetimibe]   History: Past Medical History:  Diagnosis Date   CAD (coronary artery disease)    BMS to RCA May 2009, cutting balloon angioplasty for restenosis July 2009, then DES to RCA  August 2009.   History of inferior wall myocardial infarction 11/2007   Hyperlipidemia    IBS (irritable bowel syndrome)    Loose body of right knee 08/2017   Medial meniscus tear 08/2017   right knee   MYOCARDIAL INFARCTION, INFERIOR WALL 11/27/2008   Qualifier: Diagnosis of  By: Genelle Gather CMA, [place]     Wears dentures    upper   Past Surgical History:  Procedure Laterality Date   APPENDECTOMY  07/12/2002   CARDIAC CATHETERIZATION  11/22/2007   stent RCA   CARDIAC CATHETERIZATION  02/21/2008   restenosis RCA - angioplasty   CARDIAC  CATHETERIZATION  02/28/2008   drug-eluting stent RCA   CHONDROPLASTY Right 09/13/2017   Procedure: CHONDROPLASTY RIGHT KNEE;  Surgeon: Micheline Ahr, MD;  Location: [facility];  Service: Orthopedics;  Laterality: Right;   FOOT SURGERY Right    KNEE ARTHROSCOPY Right    KNEE ARTHROSCOPY WITH MEDIAL MENISECTOMY Right 09/13/2017   Procedure: RIGHT KNEE ARTHROSCOPY WITH PARTIAL MEDIAL MENISCECTOMY;   Surgeon: Micheline Ahr, MD;  Location: [facility];  Service: Orthopedics;  Laterality: Right;   Family History  Problem Relation Age of Onset   Heart attack Father        h/o CABG and died of massive heart attackat age 69   Thyroid disease Sister    Social History   Socioeconomic History   Marital status: Widowed    Spouse name: Not on file   Number of children: Not on file   Years of education: Not on file   Highest education level: Not on file  Occupational History   Occupation: Works at a plant   Occupation: [employer]   Occupation: Builds bird houses  Tobacco Use   Smoking status: Every Day    Types: Cigarettes    Passive exposure: Never   Smokeless tobacco: Never   Tobacco comments:    1 pack/3 days  Vaping Use   Vaping status: Never Used  Substance and Sexual Activity   Alcohol use: No   Drug use: No   Sexual activity: Not Currently  Other Topics Concern   Not on file  Social History Narrative   Not on file   Social Drivers of Health   Financial Resource Strain: Low Risk  (11/05/2023)   Overall Financial Resource Strain (CARDIA)    Difficulty of Paying Living Expenses: Not hard at all  Food Insecurity: No Food Insecurity (11/05/2023)   Hunger Vital Sign    Worried About Running Out of Food in the Last Year: Never true    Ran Out of Food in the Last Year: Never true  Transportation Needs: No Transportation Needs (11/05/2023)   PRAPARE - Administrator, Civil Service (Medical): No    Lack of Transportation (Non-Medical): No  Physical Activity: Sufficiently Active (11/05/2023)   Exercise Vital Sign    Days of Exercise per Week: 5 days    Minutes of Exercise per Session: 30 min  Stress: No Stress Concern Present (11/05/2023)   [employer] of Occupational Health - Occupational Stress Questionnaire    Feeling of Stress : Not at all  Social Connections: Moderately Integrated (11/05/2023)   Social Connection and Isolation  Panel [NHANES]    Frequency of Communication with Friends and Family: More than three times a week    Frequency of Social Gatherings with Friends and Family: Three times a week    Attends Religious Services: 1 to 4 times per year    Active Member of Clubs or Organizations: Yes    Attends Banker Meetings: More than 4 times per year    Marital Status: Widowed    Tobacco Counseling Ready to quit: Not Answered Counseling given: Not Answered Tobacco comments: 1 pack/3 days    Clinical Intake:  Pre-visit preparation completed: Yes  Pain : No/denies pain     Diabetes: No  No results found for: "HGBA1C"   How often do you need to have someone help you when you read instructions, pamphlets, or other written materials from your doctor or pharmacy?: 1 - Never  Interpreter Needed?: No  Information entered by :: Kandis Fantasia LPN   Activities of Daily Living     11/05/2023    9:27 AM  In your present state of health, do you have any difficulty performing the following activities:  Hearing? 0  Vision? 0  Difficulty concentrating or making decisions? 0  Walking or climbing stairs? 0  Dressing or bathing? 0  Doing errands, shopping? 0  Preparing Food and eating ? N  Using the Toilet? N  In the past six months, have you accidently leaked urine? N  Do you have problems with loss of bowel control? N  Managing your Medications? N  Managing your Finances? N  Housekeeping or managing your Housekeeping? N    Patient Care Team: Gabriel Earing, FNP as PCP - General (Family Medicine) Jonelle Sidle, MD as PCP - Cardiology (Cardiology)  Indicate any recent Medical Services you may have received from other than [facility] providers in the past year (date may be approximate).     Assessment:   This is a routine wellness examination for Kaelum.  Hearing/Vision screen Hearing Screening - Comments:: Denies hearing difficulties   Vision Screening - Comments:: Wears rx  glasses - up to date with routine eye exams with [facility].  Wyn Forster    Goals Addressed             This Visit's Progress    Remain active and independent         Depression Screen     11/05/2023    9:29 AM 04/11/2023   10:18 AM 11/02/2022    8:53 AM 09/07/2022    8:22 AM 07/28/2022   11:36 AM 01/19/2022    8:09 AM 07/01/2021    2:29 PM  PHQ 2/9 Scores  PHQ - 2 Score 0 0 0 0 0 0 0  PHQ- 9 Score  0 0 0 1 1 1     Fall Risk     11/05/2023    9:32 AM 04/11/2023   10:18 AM 11/02/2022    8:52 AM 09/07/2022    8:22 AM 08/22/2022    9:33 AM  Fall Risk   Falls in the past year? 0 0 0 0 0  Number falls in past yr: 0  0  0  Injury with Fall? 0  0  0  Risk for fall due to : No Fall Risks  No Fall Risks  No Fall Risks  Follow up Falls prevention discussed;Education provided;Falls evaluation completed  Falls prevention discussed      MEDICARE RISK AT HOME:  Medicare Risk at Home Any stairs in or around the home?: No If so, are there any without handrails?: No Home free of loose throw rugs in walkways, pet beds, electrical cords, etc?: Yes Adequate lighting in your home to reduce risk of falls?: Yes Life alert?: No Use of a cane, walker or w/c?: No Grab bars in the bathroom?: Yes Shower chair or bench in shower?: No Elevated toilet seat or a handicapped toilet?: Yes  TIMED UP AND GO:  Was the test performed?  No   Cognitive Function: 6CIT completed        11/05/2023    9:32 AM 11/02/2022    8:55 AM  6CIT Screen  What Year? 0 points 0 points  What month? 0 points 0 points  What time? 0 points 0 points  Count back from 20 0 points 0 points  Months in reverse 0 points 0 points  Repeat phrase 0  points 0 points  Total Score 0 points 0 points    Immunizations Immunization History  Administered Date(s) Administered   Influenza,inj,Quad PF,6+ Mos 04/17/2019   PFIZER(Purple Top)SARS-COV-2 Vaccination 10/30/2019, 11/21/2019, 05/22/2020   PNEUMOCOCCAL CONJUGATE-20 10/06/2021    Pneumococcal Conjugate-13 12/21/2015   Tdap 12/20/2013, 08/24/2021   Zoster Recombinant(Shingrix) 08/24/2021, 11/12/2021    Screening Tests Health Maintenance  Topic Date Due   Colonoscopy  01/17/2024   COVID-19 Vaccine (4 - 2024-25 season) 04/26/2024 (Originally 03/25/2023)   INFLUENZA VACCINE  02/22/2024   Medicare Annual Wellness (AWV)  11/04/2024   DTaP/Tdap/Td (3 - Td or Tdap) 08/25/2031   Pneumonia Vaccine 61+ Years old  Completed   Hepatitis C Screening  Completed   Zoster Vaccines- Shingrix  Completed   HPV VACCINES  Aged Out   Meningococcal B Vaccine  Aged Out    Health Maintenance  Health Maintenance Due  Topic Date Due   Colonoscopy  01/17/2024   Health Maintenance Items Addressed: Wants to discuss colon screening with provider at next office visit; discussed Cologuard with patient and he will consider it   Additional Screening:  Vision Screening: Recommended annual ophthalmology exams for early detection of glaucoma and other disorders of the eye.  Dental Screening: Recommended annual dental exams for proper oral hygiene  Community Resource Referral / Chronic Care Management: CRR required this visit?  No   CCM required this visit?  No     Plan:     I have personally reviewed and noted the following in the patient's chart:   Medical and social history Use of alcohol, tobacco or illicit drugs  Current medications and supplements including opioid prescriptions. Patient is not currently taking opioid prescriptions. Functional ability and status Nutritional status Physical activity Advanced directives List of other physicians Hospitalizations, surgeries, and ER visits in previous 12 months Vitals Screenings to include cognitive, depression, and falls Referrals and appointments  In addition, I have reviewed and discussed with patient certain preventive protocols, quality metrics, and best practice recommendations. A written personalized care plan for  preventive services as well as general preventive health recommendations were provided to patient.     [place], [place]   1/61/0960   After Visit Summary: (Mail) Due to this being a telephonic visit, the after visit summary with patients personalized plan was offered to patient via mail   Notes: Nothing significant to report at this time.

## 2023-11-05 NOTE — Patient Instructions (Signed)
 Mr. Stai , Thank you for taking time to come for your Medicare Wellness Visit. I appreciate your ongoing commitment to your health goals. Please review the following plan we discussed and let me know if I can assist you in the future.   Referrals/Orders/Follow-Ups/Clinician Recommendations: Aim for 30 minutes of exercise or brisk walking, 6-8 glasses of water, and 5 servings of fruits and vegetables each day.  This is a list of the screening recommended for you and due dates:  Health Maintenance  Topic Date Due   Colon Cancer Screening  01/17/2024   COVID-19 Vaccine (4 - 2024-25 season) 04/26/2024*   Flu Shot  02/22/2024   Medicare Annual Wellness Visit  11/04/2024   DTaP/Tdap/Td vaccine (3 - Td or Tdap) 08/25/2031   Pneumonia Vaccine  Completed   Hepatitis C Screening  Completed   Zoster (Shingles) Vaccine  Completed   HPV Vaccine  Aged Out   Meningitis B Vaccine  Aged Out  *Topic was postponed. The date shown is not the original due date.    Advanced directives: (ACP Link)Information on Advanced Care Planning can be found at [place]  Secretary of [facility] Advance Health Care Directives Advance Health Care Directives. [email or web address]   Next Medicare Annual Wellness Visit scheduled for next year: Yes

## 2023-12-11 DIAGNOSIS — L0291 Cutaneous abscess, unspecified: Secondary | ICD-10-CM | POA: Diagnosis not present

## 2023-12-11 DIAGNOSIS — L0232 Furuncle of buttock: Secondary | ICD-10-CM | POA: Diagnosis not present

## 2023-12-27 ENCOUNTER — Ambulatory Visit: Payer: Medicare HMO | Attending: Cardiology | Admitting: Cardiology

## 2023-12-27 ENCOUNTER — Encounter: Payer: Self-pay | Admitting: Cardiology

## 2023-12-27 VITALS — BP 140/82 | HR 62 | Ht 69.0 in | Wt 165.0 lb

## 2023-12-27 DIAGNOSIS — I25119 Atherosclerotic heart disease of native coronary artery with unspecified angina pectoris: Secondary | ICD-10-CM

## 2023-12-27 DIAGNOSIS — I10 Essential (primary) hypertension: Secondary | ICD-10-CM | POA: Diagnosis not present

## 2023-12-27 DIAGNOSIS — I5032 Chronic diastolic (congestive) heart failure: Secondary | ICD-10-CM

## 2023-12-27 DIAGNOSIS — E782 Mixed hyperlipidemia: Secondary | ICD-10-CM

## 2023-12-27 NOTE — Patient Instructions (Addendum)

## 2023-12-27 NOTE — Progress Notes (Signed)
    Cardiology Office Note  Date: 12/27/2023   ID: [name], DOB [birth year], MRN [id number]  History of Present Illness: [name] is a 68 y.o. male last seen in November 2024.  He is here for a routine visit.  Reports no angina or interval nitroglycerin  use, stable NYHA class II dyspnea.  Still works with the Warden/ranger.  We went over his medications, he reports compliance with current regimen.  Tolerating increasing Pravachol  to 80 mg daily.  He will have repeat lab work with PCP later this summer.  He states that he does track blood pressure at home.  Has component of whitecoat hypertension, but I did talk with him about considering addition of low-dose ARB to further round out his regimen.  Physical Exam: VS:  BP (!) 140/82 (BP Location: Right Arm)   Pulse 62   Ht 5\' 9"  (1.753 m)   Wt 165 lb (74.8 kg)   SpO2 98%   BMI 24.37 kg/m , BMI Body mass index is 24.37 kg/m.  Wt Readings from Last 3 Encounters:  12/27/23 165 lb (74.8 kg)  11/05/23 171 lb (77.6 kg)  06/18/23 171 lb (77.6 kg)    General: Patient appears comfortable at rest. HEENT: Conjunctiva and lids normal. Neck: Supple, no elevated JVP or carotid bruits. Lungs: Clear to auscultation, nonlabored breathing at rest. Cardiac: Regular rate and rhythm, no S3 or significant systolic murmur. Extremities: No pitting edema.  ECG:  An ECG dated 06/18/2023 was personally reviewed today and demonstrated:  Sinus bradycardia with old inferior infarct pattern.  Labwork: 01/31/2023: ALT 12; AST 19; BUN 6; Creatinine, Ser 0.86; Hemoglobin 14.4; Platelets 229; Potassium 4.1; Sodium 139; TSH 3.180     Component Value Date/Time   CHOL 138 01/31/2023 1108   TRIG 90 01/31/2023 1108   HDL 36 (L) 01/31/2023 1108   CHOLHDL 3.8 01/31/2023 1108   CHOLHDL 5.3 11/23/2007 0350   VLDL 17 11/23/2007 0350   LDLCALC 85 01/31/2023 1108   Other Studies Reviewed Today:  No interval cardiac testing for review today.  Assessment and  Plan:  1.  CAD status post inferior STEMI with BMS to the RCA in 2009 followed by Cutting Balloon angioplasty for restenosis and ultimately DES that same year.  He reports no angina or interval nitroglycerin  use, stable NYHA class II dyspnea.  Continue aspirin  81 mg daily and Pravachol  80 mg daily.  He has as needed nitroglycerin  available.   2.  HFrecEF with history of ischemic cardiomyopathy, LVEF 50 to 55% by echocardiogram in April 2024.   3.  Mixed hyperlipidemia.  Continue Pravachol  80 mg daily, increased at last dose.  He will have follow-up lipids with PCP later this summer.   4.  Primary hypertension.  Continue Toprol -XL 50 mg daily.  He does report better blood pressures at home suggesting whitecoat hypertension.  I did talk with him about considering addition of low-dose ARB such as Cozaar.  Would review blood pressures with PCP at physical this summer.  Disposition:  Follow up 6 months.  Signed, Gerard Knight, M.D., F.A.C.C. [facility]

## 2024-02-01 ENCOUNTER — Encounter: Payer: Medicare HMO | Admitting: Family Medicine

## 2024-02-07 ENCOUNTER — Other Ambulatory Visit: Payer: Self-pay | Admitting: Cardiology

## 2024-02-22 DIAGNOSIS — H524 Presbyopia: Secondary | ICD-10-CM | POA: Diagnosis not present

## 2024-02-22 DIAGNOSIS — H5213 Myopia, bilateral: Secondary | ICD-10-CM | POA: Diagnosis not present

## 2024-03-06 ENCOUNTER — Ambulatory Visit: Payer: Medicare HMO | Admitting: Family Medicine

## 2024-03-06 VITALS — BP 128/81 | HR 56 | Temp 97.7°F | Ht 69.0 in | Wt 171.4 lb

## 2024-03-06 DIAGNOSIS — Z0000 Encounter for general adult medical examination without abnormal findings: Secondary | ICD-10-CM

## 2024-03-06 DIAGNOSIS — R399 Unspecified symptoms and signs involving the genitourinary system: Secondary | ICD-10-CM

## 2024-03-06 DIAGNOSIS — I10 Essential (primary) hypertension: Secondary | ICD-10-CM | POA: Diagnosis not present

## 2024-03-06 DIAGNOSIS — E782 Mixed hyperlipidemia: Secondary | ICD-10-CM | POA: Diagnosis not present

## 2024-03-06 DIAGNOSIS — R062 Wheezing: Secondary | ICD-10-CM | POA: Diagnosis not present

## 2024-03-06 DIAGNOSIS — R052 Subacute cough: Secondary | ICD-10-CM | POA: Diagnosis not present

## 2024-03-06 DIAGNOSIS — Z0001 Encounter for general adult medical examination with abnormal findings: Secondary | ICD-10-CM

## 2024-03-06 DIAGNOSIS — I25119 Atherosclerotic heart disease of native coronary artery with unspecified angina pectoris: Secondary | ICD-10-CM | POA: Diagnosis not present

## 2024-03-06 DIAGNOSIS — Z1211 Encounter for screening for malignant neoplasm of colon: Secondary | ICD-10-CM

## 2024-03-06 DIAGNOSIS — M25561 Pain in right knee: Secondary | ICD-10-CM | POA: Diagnosis not present

## 2024-03-06 MED ORDER — BENZONATATE 100 MG PO CAPS: 100.0000 mg | ORAL_CAPSULE | Freq: Two times a day (BID) | ORAL | 0 refills | Status: DC | PRN

## 2024-03-06 MED ORDER — ALBUTEROL SULFATE HFA 108 (90 BASE) MCG/ACT IN AERS: 2.0000 | INHALATION_SPRAY | Freq: Four times a day (QID) | RESPIRATORY_TRACT | 0 refills | Status: AC | PRN

## 2024-03-06 NOTE — Progress Notes (Signed)
 Complete physical exam  Patient: [name]   DOB: 1956/05/07   68 y.o. Male  MRN: [id number]  Subjective:    Chief Complaint  Patient presents with   Annual Exam    [name] is a 68 y.o. male who presents today for a complete physical exam. He reports consuming a general diet. The patient has a physically strenuous job, but has no regular exercise apart from work.  He generally feels well. He reports sleeping fairly well. He does have additional problems to discuss today.   Consistent chronic for the last 2 months. Slowly getting better. Clear sputum. Wheezing in the morning that clears after coughing. No shortness of breath. Tried breztri  without relief.   Pain in right knee- acute. Improving.   Most recent fall risk assessment:    03/06/2024    8:15 AM  Fall Risk   Falls in the past year? 0     Most recent depression screenings:    03/06/2024    8:15 AM 11/05/2023    9:29 AM  PHQ 2/9 Scores  PHQ - 2 Score 0 0  PHQ- 9 Score 1         Patient Care Team: Joesph Annabella HERO, FNP as PCP - General (Family Medicine) Debera Jayson MATSU, MD as PCP - Cardiology (Cardiology)   Outpatient Medications Prior to Visit  Medication Sig   albuterol  (VENTOLIN  HFA) 108 (90 Base) MCG/ACT inhaler Inhale 2 puffs into the lungs every 6 (six) hours as needed for wheezing or shortness of breath.   aspirin  EC (EQ ASPIRIN  ADULT LOW DOSE) 81 MG tablet Take 1 tablet (81 mg total) by mouth daily.   metoprolol  succinate (TOPROL -XL) 50 MG 24 hr tablet TAKE 1 TABLET BY MOUTH ONCE DAILY TAKE  WITH  OR  IMMEDIATELY  FOLLOWING  A  MEAL   nitroGLYCERIN  (NITROSTAT ) 0.4 MG SL tablet Place 1 tablet (0.4 mg total) under the tongue every 5 (five) minutes as needed for chest pain.   pravastatin  (PRAVACHOL ) 80 MG tablet Take 1 tablet (80 mg total) by mouth every evening.   Budeson-Glycopyrrol-Formoterol (BREZTRI  AEROSPHERE) 160-9-4.8 MCG/ACT AERO Inhale into the lungs as needed. (Patient not taking:  Reported on 03/06/2024)   [DISCONTINUED] sildenafil  (VIAGRA ) 50 MG tablet Take 1 tablet (50 mg total) by mouth daily as needed for erectile dysfunction.   No facility-administered medications prior to visit.    ROS Negative unless specially indicated above in HPI.     Objective:     BP 128/81 Comment: at hom ereading per pt  Pulse (!) 56   Temp 97.7 F (36.5 C) (Temporal)   Ht 5' 9 (1.753 m)   Wt 171 lb 6.4 oz (77.7 kg)   SpO2 97%   BMI 25.31 kg/m    Physical Exam Vitals and nursing note reviewed.  Constitutional:      General: He is not in acute distress.    Appearance: He is not ill-appearing, toxic-appearing or diaphoretic.  HENT:     Head: Normocephalic and atraumatic.     Right Ear: Tympanic membrane, ear canal and external ear normal.     Left Ear: Tympanic membrane, ear canal and external ear normal.     Nose: Nose normal.     Mouth/Throat:     Mouth: Mucous membranes are moist.     Pharynx: Oropharynx is clear.  Eyes:     Extraocular Movements: Extraocular movements intact.     Conjunctiva/sclera: Conjunctivae normal.  Pupils: Pupils are equal, round, and reactive to light.  Neck:     Thyroid : No thyroid  mass, thyromegaly or thyroid  tenderness.  Cardiovascular:     Rate and Rhythm: Normal rate and regular rhythm.     Pulses: Normal pulses.     Heart sounds: Normal heart sounds. No murmur heard.    No friction rub. No gallop.  Pulmonary:     Effort: Pulmonary effort is normal. No respiratory distress.     Breath sounds: Normal breath sounds. No wheezing, rhonchi or rales.  Chest:     Chest wall: No tenderness.  Abdominal:     General: Bowel sounds are normal. There is no distension.     Palpations: Abdomen is soft. There is no mass.     Tenderness: There is no abdominal tenderness. There is no guarding.  Musculoskeletal:        General: No swelling. Normal range of motion.     Cervical back: Normal range of motion and neck supple. No tenderness.      Right knee: Normal.  Skin:    General: Skin is warm and dry.     Capillary Refill: Capillary refill takes less than 2 seconds.     Findings: No lesion or rash.  Neurological:     General: No focal deficit present.     Mental Status: He is alert and oriented to person, place, and time.     Cranial Nerves: No cranial nerve deficit.     Motor: No weakness.     Coordination: Coordination normal.     Gait: Gait normal.  Psychiatric:        Mood and Affect: Mood normal.        Behavior: Behavior normal.        Thought Content: Thought content normal.      No results found for any visits on 03/06/24.     Assessment & Plan:    Routine Health Maintenance and Physical Exam  Hameed was seen today for annual exam.  Diagnoses and all orders for this visit:  Routine general medical examination at a health care facility  Colon cancer screening -     Ambulatory referral to Gastroenterology  Lower urinary tract symptoms (LUTS) -     PSA, total and free  Primary hypertension Well controlled on current regimen.  -     CBC with Differential/Platelet -     CMP14+EGFR -     TSH  Mixed hyperlipidemia Labs pending. On statin.  -     CMP14+EGFR -     Lipid panel  Coronary artery disease involving native coronary artery of native heart with angina pectoris (HCC) On aspirin , statin.   Acute pain of right knee RICE therapy. Return to office for new or worsening symptoms, or if symptoms persist.   Subacute cough Improving. Tessalon  perles prn.  -     benzonatate  (TESSALON ) 100 MG capsule; Take 1 capsule (100 mg total) by mouth 2 (two) times daily as needed for cough.  Wheezing Try albuterol  prn. Return to office for new or worsening symptoms, or if symptoms persist.  -     albuterol  (VENTOLIN  HFA) 108 (90 Base) MCG/ACT inhaler; Inhale 2 puffs into the lungs every 6 (six) hours as needed for wheezing or shortness of breath.    Immunization History  Administered Date(s)  Administered   Influenza,inj,Quad PF,6+ Mos 04/17/2019   PFIZER(Purple Top)SARS-COV-2 Vaccination 10/30/2019, 11/21/2019, 05/22/2020   PNEUMOCOCCAL CONJUGATE-20 10/06/2021   Pneumococcal Conjugate-13 12/21/2015  Tdap 12/20/2013, 08/24/2021   Zoster Recombinant(Shingrix ) 08/24/2021, 11/12/2021    Health Maintenance  Topic Date Due   Colonoscopy  01/17/2024   COVID-19 Vaccine (4 - 2024-25 season) 04/26/2024 (Originally 03/25/2023)   INFLUENZA VACCINE  10/21/2024 (Originally 02/22/2024)   Medicare Annual Wellness (AWV)  11/04/2024   DTaP/Tdap/Td (3 - Td or Tdap) 08/25/2031   Pneumococcal Vaccine: 50+ Years  Completed   Hepatitis C Screening  Completed   Zoster Vaccines- Shingrix   Completed   HPV VACCINES  Aged Out   Meningococcal B Vaccine  Aged Out    Discussed health benefits of physical activity, and encouraged him to engage in regular exercise appropriate for his age and condition.  Problem List Items Addressed This Visit       Cardiovascular and Mediastinum   Coronary artery disease involving native coronary artery of native heart with angina pectoris (HCC)   Primary hypertension   Relevant Orders   CBC with Differential/Platelet (Completed)   CMP14+EGFR (Completed)     Other   Mixed hyperlipidemia   Relevant Orders   CMP14+EGFR (Completed)   Other Visit Diagnoses       Routine general medical examination at a health care facility    -  Primary   Relevant Orders   Ambulatory referral to Gastroenterology   CBC with Differential/Platelet (Completed)   CMP14+EGFR (Completed)   Lipid panel (Completed)   TSH (Completed)   PSA, total and free (Completed)     Colon cancer screening       Relevant Orders   Ambulatory referral to Gastroenterology     Acute pain of right knee         Subacute cough       Relevant Medications   benzonatate  (TESSALON ) 100 MG capsule     Wheezing       Relevant Medications   albuterol  (VENTOLIN  HFA) 108 (90 Base) MCG/ACT inhaler       Return in about 6 months (around 09/06/2024) for chronic follow up.   The patient indicates understanding of these issues and agrees with the plan.  Annabella CHRISTELLA Search, FNP

## 2024-03-06 NOTE — Patient Instructions (Signed)
 Health Maintenance, Male  Adopting a healthy lifestyle and getting preventive care are important in promoting health and wellness. Ask your health care provider about:  The right schedule for you to have regular tests and exams.  Things you can do on your own to prevent diseases and keep yourself healthy.  What should I know about diet, weight, and exercise?  Eat a healthy diet    Eat a diet that includes plenty of vegetables, fruits, low-fat dairy products, and lean protein.  Do not eat a lot of foods that are high in solid fats, added sugars, or sodium.  Maintain a healthy weight  Body mass index (BMI) is a measurement that can be used to identify possible weight problems. It estimates body fat based on height and weight. Your health care provider can help determine your BMI and help you achieve or maintain a healthy weight.  Get regular exercise  Get regular exercise. This is one of the most important things you can do for your health. Most adults should:  Exercise for at least 150 minutes each week. The exercise should increase your heart rate and make you sweat (moderate-intensity exercise).  Do strengthening exercises at least twice a week. This is in addition to the moderate-intensity exercise.  Spend less time sitting. Even light physical activity can be beneficial.  Watch cholesterol and blood lipids  Have your blood tested for lipids and cholesterol at 67 years of age, then have this test every 5 years.  You may need to have your cholesterol levels checked more often if:  Your lipid or cholesterol levels are high.  You are older than 67 years of age.  You are at high risk for heart disease.  What should I know about cancer screening?  Many types of cancers can be detected early and may often be prevented. Depending on your health history and family history, you may need to have cancer screening at various ages. This may include screening for:  Colorectal cancer.  Prostate cancer.  Skin cancer.  Lung  cancer.  What should I know about heart disease, diabetes, and high blood pressure?  Blood pressure and heart disease  High blood pressure causes heart disease and increases the risk of stroke. This is more likely to develop in people who have high blood pressure readings or are overweight.  Talk with your health care provider about your target blood pressure readings.  Have your blood pressure checked:  Every 3-5 years if you are 67-67 years of age.  Every year if you are 67 years old or older.  If you are between the ages of 60 and 72 and are a current or former smoker, ask your health care provider if you should have a one-time screening for abdominal aortic aneurysm (AAA).  Diabetes  Have regular diabetes screenings. This checks your fasting blood sugar level. Have the screening done:  Once every three years after age 66 if you are at a normal weight and have a low risk for diabetes.  More often and at a younger age if you are overweight or have a high risk for diabetes.  What should I know about preventing infection?  Hepatitis B  If you have a higher risk for hepatitis B, you should be screened for this virus. Talk with your health care provider to find out if you are at risk for hepatitis B infection.  Hepatitis C  Blood testing is recommended for:  Everyone born from [birth year] through 1964.  Anyone  with known risk factors for hepatitis C.  Sexually transmitted infections (STIs)  You should be screened each year for STIs, including gonorrhea and chlamydia, if:  You are sexually active and are younger than 67 years of age.  You are older than 67 years of age and your health care provider tells you that you are at risk for this type of infection.  Your sexual activity has changed since you were last screened, and you are at increased risk for chlamydia or gonorrhea. Ask your health care provider if you are at risk.  Ask your health care provider about whether you are at high risk for HIV. Your health care provider  may recommend a prescription medicine to help prevent HIV infection. If you choose to take medicine to prevent HIV, you should first get tested for HIV. You should then be tested every 3 months for as long as you are taking the medicine.  Follow these instructions at home:  Alcohol use  Do not drink alcohol if your health care provider tells you not to drink.  If you drink alcohol:  Limit how much you have to 0-2 drinks a day.  Know how much alcohol is in your drink. In the [place] one drink equals one 12 oz bottle of beer (355 mL), one 5 oz glass of wine (148 mL), or one 1 oz glass of hard liquor (44 mL).  Lifestyle  Do not use any products that contain nicotine or tobacco. These products include cigarettes, chewing tobacco, and vaping devices, such as e-cigarettes. If you need help quitting, ask your health care provider.  Do not use street drugs.  Do not share needles.  Ask your health care provider for help if you need support or information about quitting drugs.  General instructions  Schedule regular health, dental, and eye exams.  Stay current with your vaccines.  Tell your health care provider if:  You often feel depressed.  You have ever been abused or do not feel safe at home.  Summary  Adopting a healthy lifestyle and getting preventive care are important in promoting health and wellness.  Follow your health care provider's instructions about healthy diet, exercising, and getting tested or screened for diseases.  Follow your health care provider's instructions on monitoring your cholesterol and blood pressure.  This information is not intended to replace advice given to you by your health care provider. Make sure you discuss any questions you have with your health care provider.  Document Revised: 11/29/2020 Document Reviewed: 11/29/2020  Elsevier Patient Education  2024 [employer].

## 2024-03-07 ENCOUNTER — Encounter (INDEPENDENT_AMBULATORY_CARE_PROVIDER_SITE_OTHER): Payer: Self-pay | Admitting: *Deleted

## 2024-03-07 ENCOUNTER — Ambulatory Visit: Payer: Self-pay | Admitting: Family Medicine

## 2024-03-07 ENCOUNTER — Telehealth: Payer: Self-pay

## 2024-03-07 LAB — CMP14+EGFR
ALT: 9 IU/L (ref 0–44)
AST: 20 IU/L (ref 0–40)
Albumin: 4 g/dL (ref 3.9–4.9)
Alkaline Phosphatase: 125 IU/L — ABNORMAL HIGH (ref 44–121)
BUN/Creatinine Ratio: 9 — ABNORMAL LOW (ref 10–24)
BUN: 9 mg/dL (ref 8–27)
Bilirubin Total: 0.4 mg/dL (ref 0.0–1.2)
CO2: 21 mmol/L (ref 20–29)
Calcium: 9.1 mg/dL (ref 8.6–10.2)
Chloride: 103 mmol/L (ref 96–106)
Creatinine, Ser: 0.98 mg/dL (ref 0.76–1.27)
Globulin, Total: 2.3 g/dL (ref 1.5–4.5)
Glucose: 94 mg/dL (ref 70–99)
Potassium: 4.1 mmol/L (ref 3.5–5.2)
Sodium: 139 mmol/L (ref 134–144)
Total Protein: 6.3 g/dL (ref 6.0–8.5)
eGFR: 85 mL/min/1.73 (ref >59)

## 2024-03-07 LAB — LIPID PANEL
Chol/HDL Ratio: 3.6 ratio (ref 0.0–5.0)
Cholesterol, Total: 130 mg/dL (ref 100–199)
HDL: 36 mg/dL — ABNORMAL LOW (ref >39)
LDL Chol Calc (NIH): 77 mg/dL (ref 0–99)
Triglycerides: 90 mg/dL (ref 0–149)
VLDL Cholesterol Cal: 17 mg/dL (ref 5–40)

## 2024-03-07 LAB — CBC WITH DIFFERENTIAL/PLATELET
Basophils Absolute: 0.1 x10E3/uL (ref 0.0–0.2)
Basos: 1 %
EOS (ABSOLUTE): 0.4 x10E3/uL (ref 0.0–0.4)
Eos: 4 %
Hematocrit: 43.3 % (ref 37.5–51.0)
Hemoglobin: 14.1 g/dL (ref 13.0–17.7)
Immature Grans (Abs): 0 x10E3/uL (ref 0.0–0.1)
Immature Granulocytes: 0 %
Lymphocytes Absolute: 4.3 x10E3/uL — ABNORMAL HIGH (ref 0.7–3.1)
Lymphs: 44 %
MCH: 31.4 pg (ref 26.6–33.0)
MCHC: 32.6 g/dL (ref 31.5–35.7)
MCV: 96 fL (ref 79–97)
Monocytes Absolute: 0.8 x10E3/uL (ref 0.1–0.9)
Monocytes: 8 %
Neutrophils Absolute: 4.2 x10E3/uL (ref 1.4–7.0)
Neutrophils: 43 %
Platelets: 259 x10E3/uL (ref 150–450)
RBC: 4.49 x10E6/uL (ref 4.14–5.80)
RDW: 14 % (ref 11.6–15.4)
WBC: 9.8 x10E3/uL (ref 3.4–10.8)

## 2024-03-07 LAB — TSH: TSH: 4.79 u[IU]/mL — ABNORMAL HIGH (ref 0.450–4.500)

## 2024-03-07 LAB — PSA, TOTAL AND FREE
PSA, Free Pct: 34 %
PSA, Free: 0.17 ng/mL
Prostate Specific Ag, Serum: 0.5 ng/mL (ref 0.0–4.0)

## 2024-03-07 NOTE — Telephone Encounter (Signed)
 Copied from CRM #[id number]. Topic: Clinical - Lab/Test Results >> Mar 07, 2024 12:45 PM Santiya F wrote: Reason for CRM: Patient is calling in for his lab results. Results given to patient. Patient does have some questions about his good cholesterol and wants to know if he should discontinue one of his medications. Please advise. Patient says if he doesn't answer it's okay to leave a voicemail.

## 2024-03-07 NOTE — Telephone Encounter (Signed)
 I spoke with pt and advised of results and answered all questions.

## 2024-03-17 ENCOUNTER — Encounter: Payer: Self-pay | Admitting: Family Medicine

## 2024-04-04 ENCOUNTER — Other Ambulatory Visit: Payer: Self-pay | Admitting: Cardiology

## 2024-04-29 ENCOUNTER — Ambulatory Visit: Payer: Self-pay

## 2024-04-29 ENCOUNTER — Ambulatory Visit: Admitting: Family Medicine

## 2024-04-29 ENCOUNTER — Encounter: Payer: Self-pay | Admitting: Family Medicine

## 2024-04-29 VITALS — BP 155/73 | HR 55 | Temp 98.3°F | Ht 69.0 in | Wt 173.6 lb

## 2024-04-29 DIAGNOSIS — L723 Sebaceous cyst: Secondary | ICD-10-CM | POA: Diagnosis not present

## 2024-04-29 DIAGNOSIS — L0231 Cutaneous abscess of buttock: Secondary | ICD-10-CM

## 2024-04-29 MED ORDER — AMOXICILLIN-POT CLAVULANATE 875-125 MG PO TABS: 1.0000 | ORAL_TABLET | Freq: Two times a day (BID) | ORAL | 0 refills | Status: DC

## 2024-04-29 NOTE — Telephone Encounter (Signed)
 FYI Only or Action Required?: FYI only for provider.  Patient was last seen in primary care on 03/06/2024 by Billy Annabella HERO, FNP.  Called Nurse Triage reporting Cyst. Cyst has opened and has drained.  Symptoms began several days ago.  Interventions attempted: Other: covered with a band-aid.  Symptoms are: gradually improving.  Triage Disposition: See PCP When Office is Open (Within 3 Days) Appt scheduled for today.  Patient/caregiver understands and will follow disposition?: Yes                      Copied from CRM [id number]. Topic: Clinical - Red Word Triage >> Apr 29, 2024  8:05 AM [name] wrote: Red Word that prompted transfer to Nurse Triage: open cyst on buttocks, causing pain Reason for Disposition  Boil > 1/2 inch across (> 12 mm; larger than a marble)  Answer Assessment - Initial Assessment Questions Pt has an open skin area on right buttock. Pt noticed on Friday when leaning against a hard surface and it popped open . It had yellow, clear and bloody discharge. Pt has something similar in August, however it did not open up. Due to location pt cannot see it well. Pt wishes to be seen. He feels there is a sack inside that needs to be removed. Currently no pain, but it does itch. Pt is keeping it covered with a large Band-Aid.  1. APPEARANCE of BOIL: What does the boil look like?      Pt describes it as a cyst that has opened up on Friday 2. LOCATION: Where is the boil located?      Buttocks - right  lower side 3. NUMBER: How many boils are there?      1 4. SIZE: How big is the boil? (e.g., inches, cm; compare to size of a coin or other object)     Size of a quarter to 50cent piece - now smaller 5. ONSET: When did the boil start?     Unsure -  opened on Friday 6. PAIN: Is there any pain? If Yes, ask: How bad is the pain?   (Scale 1-10; or mild, moderate, severe)     Itches a lot  - hurts when he sits on hard chair 7. FEVER: Do you have a  fever? If Yes, ask: What is it, how was it measured, and when did it start?      No - does not feel warm 8. SOURCE: Have you been around anyone with boils or other Staph infections? Have you ever had boils before?     Perhaps in August 9. OTHER SYMPTOMS: Do you have any other symptoms? (e.g., shaking chills, weakness, rash elsewhere on body)     no  Protocols used: Boil (Skin Abscess)-A-AH

## 2024-04-29 NOTE — Progress Notes (Signed)
 Chief Complaint  Patient presents with   Cyst    Right buttocks Opened up, leaking    HPI  Patient presents today for Discussed the use of AI scribe software for clinical note transcription with the patient, who gave verbal consent to proceed.  History of Present Illness [name] is a 68 year old male who presents with a recurrent abscess on his bottom.  In August 2025, he developed an abscess on his bottom and sought care at an urgent care facility in [place], where he was prescribed antibiotics, which resolved the issue temporarily. However, the abscess recurred on Thursday of last week, approximately five to six days ago.  On Friday, while leaning against a fire truck, he experienced a sudden relief and noticed a wet sensation on his hand. Upon checking, he found a bloody, yellow, and purulent discharge from the abscess site. He managed the situation by applying a large Band-Aid and using heat therapy, which provided some relief, but he still feels a 'little twitch' at the site.  He has been unable to seek immediate medical attention due to work commitments and clinic closures over the weekend.  He is not allergic to penicillin and has been using heat therapy, such as sitting on a heating pad or using a hot rag, to manage the symptoms. 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     PMH: Smoking status noted Review of Systems  Objective: BP (!) 155/73   Pulse (!) 55   Temp 98.3 F (36.8 C)   Ht 5' 9 (1.753 m)   Wt 173 lb 9.6 oz (78.7 kg)   SpO2 95%   BMI 25.64 kg/m  Gen: NAD, alert, cooperative with exam HEENT: NCAT, EOMI, PERRL CV: RRR, good S1/S2, no murmur Resp: CTABL, no wheezes, non-labored Ext: No edema, warm Neuro: Alert and oriented, No gross deficits Skin: 1X2 cm hyperemic area at inferior right buttocks. There is no fluctuance or induration. No mass.  Sebaceous cyst  Abscess of buttock,  right    Assessment and Plan Assessment & Plan Perianal abscess   Recurrent perianal abscess since August, initially treated with antibiotics. Recent recurrence involved spontaneous drainage of bloody, yellow exudate. Currently, there is improvement with no fluid accumulation, but recurrence is possible due to the presence of a sac. Prescribe Augmentin (amoxicillin/clavulanate) twice daily for 10 days. Advise warm sitz baths and application of heat for comfort. Discuss the potential need for surgical removal of the sac to prevent recurrence. Recommend consultation with Dr. Mavis in [place] for potential surgical intervention. Coordinate with primary care provider Tiffany for referral if needed.   F/U prn   Butler Der, MD

## 2024-04-29 NOTE — Telephone Encounter (Signed)
 Appt made.

## 2024-07-23 ENCOUNTER — Encounter: Payer: Self-pay | Admitting: Cardiology

## 2024-07-23 ENCOUNTER — Ambulatory Visit: Admitting: Cardiology

## 2024-07-23 VITALS — BP 138/82 | HR 62 | Ht 69.0 in | Wt 174.6 lb

## 2024-07-23 DIAGNOSIS — I5020 Unspecified systolic (congestive) heart failure: Secondary | ICD-10-CM

## 2024-07-23 DIAGNOSIS — E782 Mixed hyperlipidemia: Secondary | ICD-10-CM | POA: Diagnosis not present

## 2024-07-23 DIAGNOSIS — I25119 Atherosclerotic heart disease of native coronary artery with unspecified angina pectoris: Secondary | ICD-10-CM

## 2024-07-23 DIAGNOSIS — I10 Essential (primary) hypertension: Secondary | ICD-10-CM | POA: Diagnosis not present

## 2024-07-23 NOTE — Patient Instructions (Signed)

## 2024-07-23 NOTE — Progress Notes (Signed)
"  ° ° °  Cardiology Office Note  Date: 07/23/2024   ID: [name], DOB 06/24/1956, MRN [id number]  History of Present Illness: [name] is a 68 y.o. male last seen in June.  He is here for a routine visit.  Reports no angina or interval nitroglycerin  use.  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  Reports NYHA class II dyspnea, no palpitations or syncope.  States that he checks blood pressure at home, systolics generally in the 120s to 130s.  He reports compliance with his medications which I reviewed.  He had lab work in August with his PCP, LDL was 77 at that time.  Normal renal function.  I reviewed his ECG today which shows sinus rhythm with old inferior infarct pattern.  Physical Exam: VS:  BP 138/82   Pulse 62   Ht 5' 9 (1.753 m)   Wt 174 lb 9.6 oz (79.2 kg)   SpO2 97%   BMI 25.78 kg/m , BMI Body mass index is 25.78 kg/m.  Wt Readings from Last 3 Encounters:  07/23/24 174 lb 9.6 oz (79.2 kg)  04/29/24 173 lb 9.6 oz (78.7 kg)  03/06/24 171 lb 6.4 oz (77.7 kg)    General: Patient appears comfortable at rest. HEENT: Conjunctiva and lids normal. Neck: Supple, no elevated JVP or carotid bruits. Lungs: Clear to auscultation, nonlabored breathing at rest. Cardiac: Regular rate and rhythm, no S3 or significant systolic murmur. Extremities: No pitting edema.  ECG:  An ECG dated 06/18/2023 was personally reviewed today and demonstrated:  Sinus bradycardia with old inferior infarct pattern.  Labwork: 03/06/2024: ALT 9; AST 20; BUN 9; Creatinine, Ser 0.98; Hemoglobin 14.1; Platelets 259; Potassium 4.1; Sodium 139; TSH 4.790     Component Value Date/Time   CHOL 130 03/06/2024 0843   TRIG 90 03/06/2024 0843   HDL 36 (L) 03/06/2024 0843   CHOLHDL 3.6 03/06/2024 0843   CHOLHDL 5.3 11/23/2007 0350   VLDL 17 11/23/2007 0350   LDLCALC 77 03/06/2024 0843   Other Studies Reviewed Today:  No interval cardiac testing for review today.  Assessment  and Plan:  1.  CAD status post inferior STEMI with BMS to the RCA in 2009 followed by [place] angioplasty for restenosis and ultimately DES that same year.  Reports no angina or interval nitroglycerin  use.  ECG reviewed and stable.  Plan to continue medical therapy and observation.  Currently on aspirin  81 mg daily, statin, and as needed nitroglycerin .   2.  HFrecEF with history of ischemic cardiomyopathy, LVEF 50 to 55% by echocardiogram in April 2024.   3.  Mixed hyperlipidemia.  LDL 77 in August.  Continue Pravachol  80 mg daily.  Has prior intolerance to Zetia .   4.  Primary hypertension.  Continue to track blood pressure at home and follow-up with PCP.  Currently on Toprol -XL 50 mg daily.  Disposition:  Follow up 1 year.  Signed, Jayson JUDITHANN Sierras, M.D., F.A.C.C. [facility]

## 2024-11-05 ENCOUNTER — Ambulatory Visit: Payer: Self-pay

## 2025-03-09 ENCOUNTER — Encounter: Payer: Self-pay | Admitting: Family Medicine
